# Patient Record
Sex: Female | Born: 1965 | Race: White | Hispanic: No | Smoking: Former smoker
Health system: Southern US, Community
[De-identification: ages and names within clinical notes are randomized; demographics above are authoritative.]

## PROBLEM LIST (undated history)

## (undated) DIAGNOSIS — R000 Tachycardia, unspecified: Secondary | ICD-10-CM

## (undated) DIAGNOSIS — R739 Hyperglycemia, unspecified: Secondary | ICD-10-CM

## (undated) HISTORY — PX: CHOLECYSTECTOMY: SHX55

## (undated) HISTORY — PX: BREAST BIOPSY: SHX20

## (undated) HISTORY — PX: ABDOMINAL HYSTERECTOMY: SHX81

## (undated) HISTORY — PX: APPENDECTOMY: SHX54

---

## 1997-08-14 ENCOUNTER — Ambulatory Visit (HOSPITAL_COMMUNITY): Admission: RE | Admit: 1997-08-14 | Discharge: 1997-08-14 | Payer: Self-pay | Admitting: Internal Medicine

## 1998-04-22 ENCOUNTER — Ambulatory Visit (HOSPITAL_BASED_OUTPATIENT_CLINIC_OR_DEPARTMENT_OTHER): Admission: RE | Admit: 1998-04-22 | Discharge: 1998-04-22 | Payer: Self-pay | Admitting: Orthopedic Surgery

## 2001-04-18 ENCOUNTER — Ambulatory Visit (HOSPITAL_BASED_OUTPATIENT_CLINIC_OR_DEPARTMENT_OTHER): Admission: RE | Admit: 2001-04-18 | Discharge: 2001-04-18 | Payer: Self-pay | Admitting: Orthopedic Surgery

## 2008-10-10 ENCOUNTER — Encounter: Admission: RE | Admit: 2008-10-10 | Discharge: 2008-10-10 | Payer: Self-pay | Admitting: Family Medicine

## 2009-03-05 ENCOUNTER — Encounter: Admission: RE | Admit: 2009-03-05 | Discharge: 2009-03-05 | Payer: Self-pay | Admitting: Obstetrics and Gynecology

## 2009-08-09 ENCOUNTER — Encounter: Admission: RE | Admit: 2009-08-09 | Discharge: 2009-08-09 | Payer: Self-pay | Admitting: Family Medicine

## 2011-10-17 ENCOUNTER — Other Ambulatory Visit: Payer: Self-pay | Admitting: Family Medicine

## 2011-10-17 DIAGNOSIS — Z1231 Encounter for screening mammogram for malignant neoplasm of breast: Secondary | ICD-10-CM

## 2011-11-04 ENCOUNTER — Ambulatory Visit
Admission: RE | Admit: 2011-11-04 | Discharge: 2011-11-04 | Disposition: A | Discharge status: Home / Self Care | Payer: BC Managed Care – PPO | Source: Ambulatory Visit | Attending: Family Medicine | Admitting: Family Medicine

## 2011-11-04 DIAGNOSIS — Z1231 Encounter for screening mammogram for malignant neoplasm of breast: Secondary | ICD-10-CM

## 2011-11-09 ENCOUNTER — Other Ambulatory Visit: Payer: Self-pay | Admitting: Family Medicine

## 2011-11-09 DIAGNOSIS — R928 Other abnormal and inconclusive findings on diagnostic imaging of breast: Secondary | ICD-10-CM

## 2011-11-14 ENCOUNTER — Ambulatory Visit
Admission: RE | Admit: 2011-11-14 | Discharge: 2011-11-14 | Disposition: A | Discharge status: Home / Self Care | Payer: BC Managed Care – PPO | Source: Ambulatory Visit | Attending: Family Medicine | Admitting: Family Medicine

## 2011-11-14 ENCOUNTER — Other Ambulatory Visit: Payer: Self-pay | Admitting: Family Medicine

## 2011-11-14 DIAGNOSIS — R928 Other abnormal and inconclusive findings on diagnostic imaging of breast: Secondary | ICD-10-CM

## 2011-11-14 DIAGNOSIS — N63 Unspecified lump in unspecified breast: Secondary | ICD-10-CM

## 2011-11-25 ENCOUNTER — Ambulatory Visit
Admission: RE | Admit: 2011-11-25 | Discharge: 2011-11-25 | Disposition: A | Discharge status: Home / Self Care | Payer: BC Managed Care – PPO | Source: Ambulatory Visit | Attending: Family Medicine | Admitting: Family Medicine

## 2011-11-25 ENCOUNTER — Other Ambulatory Visit: Payer: Self-pay | Admitting: Family Medicine

## 2011-11-25 DIAGNOSIS — R928 Other abnormal and inconclusive findings on diagnostic imaging of breast: Secondary | ICD-10-CM

## 2011-11-25 DIAGNOSIS — N63 Unspecified lump in unspecified breast: Secondary | ICD-10-CM

## 2013-01-29 ENCOUNTER — Other Ambulatory Visit: Payer: Self-pay

## 2013-01-29 DIAGNOSIS — Z1231 Encounter for screening mammogram for malignant neoplasm of breast: Secondary | ICD-10-CM

## 2013-03-15 ENCOUNTER — Ambulatory Visit
Admission: RE | Admit: 2013-03-15 | Discharge: 2013-03-15 | Disposition: A | Discharge status: Home / Self Care | Payer: BC Managed Care – PPO | Source: Ambulatory Visit

## 2013-03-15 DIAGNOSIS — Z1231 Encounter for screening mammogram for malignant neoplasm of breast: Secondary | ICD-10-CM

## 2014-07-11 ENCOUNTER — Other Ambulatory Visit: Payer: Self-pay

## 2014-07-11 DIAGNOSIS — Z1231 Encounter for screening mammogram for malignant neoplasm of breast: Secondary | ICD-10-CM

## 2014-07-24 ENCOUNTER — Ambulatory Visit
Admission: RE | Admit: 2014-07-24 | Discharge: 2014-07-24 | Disposition: A | Discharge status: Home / Self Care | Payer: BLUE CROSS/BLUE SHIELD | Source: Ambulatory Visit

## 2014-07-24 DIAGNOSIS — Z1231 Encounter for screening mammogram for malignant neoplasm of breast: Secondary | ICD-10-CM

## 2014-12-04 ENCOUNTER — Encounter (HOSPITAL_BASED_OUTPATIENT_CLINIC_OR_DEPARTMENT_OTHER): Payer: Self-pay | Admitting: Emergency Medicine

## 2014-12-04 ENCOUNTER — Emergency Department (HOSPITAL_BASED_OUTPATIENT_CLINIC_OR_DEPARTMENT_OTHER)
Admission: EM | Admit: 2014-12-04 | Discharge: 2014-12-04 | Disposition: A | Discharge status: Home / Self Care | Payer: BLUE CROSS/BLUE SHIELD | Attending: Emergency Medicine | Admitting: Emergency Medicine

## 2014-12-04 ENCOUNTER — Emergency Department (HOSPITAL_BASED_OUTPATIENT_CLINIC_OR_DEPARTMENT_OTHER): Payer: BLUE CROSS/BLUE SHIELD

## 2014-12-04 DIAGNOSIS — N390 Urinary tract infection, site not specified: Secondary | ICD-10-CM

## 2014-12-04 DIAGNOSIS — N200 Calculus of kidney: Secondary | ICD-10-CM | POA: Diagnosis not present

## 2014-12-04 DIAGNOSIS — Z720 Tobacco use: Secondary | ICD-10-CM | POA: Diagnosis not present

## 2014-12-04 DIAGNOSIS — R109 Unspecified abdominal pain: Secondary | ICD-10-CM | POA: Diagnosis present

## 2014-12-04 DIAGNOSIS — Z79899 Other long term (current) drug therapy: Secondary | ICD-10-CM | POA: Diagnosis not present

## 2014-12-04 DIAGNOSIS — R52 Pain, unspecified: Secondary | ICD-10-CM

## 2014-12-04 HISTORY — DX: Tachycardia, unspecified: R00.0

## 2014-12-04 HISTORY — DX: Hyperglycemia, unspecified: R73.9

## 2014-12-04 LAB — URINALYSIS, ROUTINE W REFLEX MICROSCOPIC
Bilirubin Urine: NEGATIVE
Glucose, UA: NEGATIVE mg/dL
Hgb urine dipstick: NEGATIVE
Ketones, ur: NEGATIVE mg/dL
Leukocytes, UA: NEGATIVE
Nitrite: POSITIVE — AB
Protein, ur: NEGATIVE mg/dL
Specific Gravity, Urine: 1.016 (ref 1.005–1.030)
Urobilinogen, UA: 0.2 mg/dL (ref 0.0–1.0)
pH: 5 (ref 5.0–8.0)

## 2014-12-04 LAB — URINE MICROSCOPIC-ADD ON

## 2014-12-04 LAB — CBC WITH DIFFERENTIAL/PLATELET
Basophils Absolute: 0.1 10*3/uL (ref 0.0–0.1)
Basophils Relative: 1 %
Eosinophils Absolute: 0.1 10*3/uL (ref 0.0–0.7)
Eosinophils Relative: 1 %
HCT: 43.6 % (ref 36.0–46.0)
Hemoglobin: 14.8 g/dL (ref 12.0–15.0)
Lymphocytes Relative: 28 %
Lymphs Abs: 4.2 10*3/uL — ABNORMAL HIGH (ref 0.7–4.0)
MCH: 29.5 pg (ref 26.0–34.0)
MCHC: 33.9 g/dL (ref 30.0–36.0)
MCV: 87 fL (ref 78.0–100.0)
Monocytes Absolute: 1.1 10*3/uL — ABNORMAL HIGH (ref 0.1–1.0)
Monocytes Relative: 7 %
Neutro Abs: 9.2 10*3/uL — ABNORMAL HIGH (ref 1.7–7.7)
Neutrophils Relative %: 63 %
Platelets: 283 10*3/uL (ref 150–400)
RBC: 5.01 MIL/uL (ref 3.87–5.11)
RDW: 12.9 % (ref 11.5–15.5)
WBC: 14.6 10*3/uL — ABNORMAL HIGH (ref 4.0–10.5)

## 2014-12-04 LAB — COMPREHENSIVE METABOLIC PANEL
ALK PHOS: 81 U/L (ref 38–126)
ALT: 36 U/L (ref 14–54)
ANION GAP: 11 (ref 5–15)
AST: 30 U/L (ref 15–41)
Albumin: 4.2 g/dL (ref 3.5–5.0)
BILIRUBIN TOTAL: 0.5 mg/dL (ref 0.3–1.2)
BUN: 17 mg/dL (ref 6–20)
CALCIUM: 9.2 mg/dL (ref 8.9–10.3)
CO2: 24 mmol/L (ref 22–32)
CREATININE: 1.09 mg/dL — AB (ref 0.44–1.00)
Chloride: 102 mmol/L (ref 101–111)
GFR calc non Af Amer: 58 mL/min — ABNORMAL LOW (ref >60)
Glucose, Bld: 205 mg/dL — ABNORMAL HIGH (ref 65–99)
Potassium: 3.6 mmol/L (ref 3.5–5.1)
SODIUM: 137 mmol/L (ref 135–145)
TOTAL PROTEIN: 6.9 g/dL (ref 6.5–8.1)

## 2014-12-04 MED ORDER — TAMSULOSIN HCL 0.4 MG PO CAPS
0.4000 mg | ORAL_CAPSULE | Freq: Every day | ORAL | Status: DC
  Administered 2014-12-04: 0.4 mg via ORAL
  Filled 2014-12-04: qty 1

## 2014-12-04 MED ORDER — TAMSULOSIN HCL 0.4 MG PO CAPS: 0.4000 mg | ORAL_CAPSULE | Freq: Every day | ORAL | Status: DC

## 2014-12-04 MED ORDER — SODIUM CHLORIDE 0.9 % IV BOLUS (SEPSIS)
1000.0000 mL | Freq: Once | INTRAVENOUS | Status: AC
  Administered 2014-12-04: 1000 mL via INTRAVENOUS

## 2014-12-04 MED ORDER — TRAMADOL HCL 50 MG PO TABS: 50.0000 mg | ORAL_TABLET | Freq: Four times a day (QID) | ORAL | Status: AC | PRN

## 2014-12-04 MED ORDER — ONDANSETRON HCL 4 MG/2ML IJ SOLN
4.0000 mg | Freq: Once | INTRAMUSCULAR | Status: AC
  Administered 2014-12-04: 4 mg via INTRAVENOUS
  Filled 2014-12-04: qty 2

## 2014-12-04 MED ORDER — IBUPROFEN 800 MG PO TABS: 800.0000 mg | ORAL_TABLET | Freq: Three times a day (TID) | ORAL | Status: DC

## 2014-12-04 MED ORDER — CEFTRIAXONE SODIUM 1 G IJ SOLR
INTRAMUSCULAR | Status: AC
  Filled 2014-12-04: qty 10

## 2014-12-04 MED ORDER — NITROFURANTOIN MONOHYD MACRO 100 MG PO CAPS: 100.0000 mg | ORAL_CAPSULE | Freq: Two times a day (BID) | ORAL | Status: DC

## 2014-12-04 MED ORDER — ONDANSETRON 8 MG PO TBDP: ORAL_TABLET | ORAL | Status: DC

## 2014-12-04 MED ORDER — OXYCODONE-ACETAMINOPHEN 5-325 MG PO TABS
2.0000 | ORAL_TABLET | Freq: Once | ORAL | Status: AC
  Administered 2014-12-04: 2 via ORAL
  Filled 2014-12-04: qty 2

## 2014-12-04 MED ORDER — CEFTRIAXONE SODIUM 1 G IJ SOLR
1.0000 g | Freq: Once | INTRAMUSCULAR | Status: AC
  Administered 2014-12-04: 1 g via INTRAVENOUS

## 2014-12-04 MED ORDER — KETOROLAC TROMETHAMINE 30 MG/ML IJ SOLN
30.0000 mg | Freq: Once | INTRAMUSCULAR | Status: AC
  Administered 2014-12-04: 30 mg via INTRAVENOUS
  Filled 2014-12-04: qty 1

## 2014-12-04 NOTE — ED Notes (Signed)
Patient transported to CT 

## 2014-12-04 NOTE — ED Provider Notes (Signed)
CSN: [id number]     Arrival date & time 12/04/14  0129 History   First MD Initiated Contact with Patient 12/04/14 0158     Chief Complaint  Patient presents with  . Abdominal Pain     (Consider location/radiation/quality/duration/timing/severity/associated sxs/prior Treatment) Patient is a 49 y.o. female presenting with flank pain.  Flank Pain This is a new problem. The current episode started 12 to 24 hours ago. The problem occurs constantly. The problem has not changed since onset.Pertinent negatives include no chest pain, no headaches and no shortness of breath. Associated symptoms comments: Groin pain. Nothing aggravates the symptoms. Nothing relieves the symptoms. She has tried nothing for the symptoms. The treatment provided no relief.  Started with several bouts of diarrhea then flank pain now vomiting and groin pain.    Past Medical History  Diagnosis Date  . Hyperglycemia   . Tachycardia    Past Surgical History  Procedure Laterality Date  . Abdominal hysterectomy    . Appendectomy    . Cholecystectomy     History reviewed. No pertinent family history. Social History  Substance Use Topics  . Smoking status: Current Every Day Smoker -- 0.50 packs/day  . Smokeless tobacco: None  . Alcohol Use: No   OB History    No data available     Review of Systems  Respiratory: Negative for shortness of breath.   Cardiovascular: Negative for chest pain.  Gastrointestinal: Negative for nausea, vomiting and diarrhea.  Genitourinary: Positive for flank pain. Negative for dysuria and hematuria.  Neurological: Negative for headaches.  All other systems reviewed and are negative.     Allergies  Morphine and related  Home Medications   Prior to Admission medications   Medication Sig Start Date End Date Taking? Authorizing Saren Corkern  metFORMIN (GLUCOPHAGE) 1000 MG tablet Take 1,000 mg by mouth at bedtime.   Yes Historical Yamila Cragin, MD  nebivolol (BYSTOLIC) 5 MG tablet Take 5  mg by mouth daily.   Yes Historical Jayan Raymundo, MD   BP 180/87 mmHg  Pulse 96  Temp(Src) 98.6 F (37 C) (Oral)  Resp 22  Ht  (1.702 m)  Wt 247 lb (112.038 kg)  BMI 38.68 kg/m2  SpO2 98%  LMP  Physical Exam  Constitutional: She is oriented to person, place, and time. She appears well-developed and well-nourished.  HENT:  Head: Normocephalic and atraumatic.  Mouth/Throat: Oropharynx is clear and moist.  Eyes: Conjunctivae are normal. Pupils are equal, round, and reactive to light.  Neck: Normal range of motion. Neck supple.  Cardiovascular: Normal rate, regular rhythm and intact distal pulses.   Pulmonary/Chest: Effort normal and breath sounds normal. No respiratory distress. She has no wheezes. She has no rales.  Abdominal: Soft. Bowel sounds are normal. There is no tenderness. There is no rebound and no guarding.  Musculoskeletal: Normal range of motion.  Neurological: She is alert and oriented to person, place, and time.  Skin: Skin is warm and dry. She is not diaphoretic.  Psychiatric: She has a normal mood and affect.    ED Course  Procedures (including critical care time) Labs Review Labs Reviewed  CBC WITH DIFFERENTIAL/PLATELET - Abnormal; Notable for the following:    WBC 14.6 (*)    Neutro Abs 9.2 (*)    Lymphs Abs 4.2 (*)    Monocytes Absolute 1.1 (*)    All other components within normal limits  COMPREHENSIVE METABOLIC PANEL - Abnormal; Notable for the following:    Glucose, Bld 205 (*)  Creatinine, Ser 1.09 (*)    GFR calc non Af Amer 58 (*)    All other components within normal limits  URINALYSIS, ROUTINE W REFLEX MICROSCOPIC (NOT AT [facility]) - Abnormal; Notable for the following:    Color, Urine ORANGE (*)    Nitrite POSITIVE (*)    All other components within normal limits  URINE MICROSCOPIC-ADD ON - Abnormal; Notable for the following:    Squamous Epithelial / LPF FEW (*)    Bacteria, UA MANY (*)    All other components within normal limits     Imaging Review Ct Renal Stone Study  12/04/2014   CLINICAL DATA:  Left flank and left lower quadrant pain for 10 hr.  EXAM: CT ABDOMEN AND PELVIS WITHOUT CONTRAST  TECHNIQUE: Multidetector CT imaging of the abdomen and pelvis was performed following the standard protocol without IV contrast.  COMPARISON:  None.  FINDINGS: There is a 4 mm nodule in the right middle lobe. 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  There is a 2 mm obstructing stone in the distal left ureter just proximal to the ureterovesicular junction with moderate proximal hydroureteronephrosis and perinephric stranding. No additional nonobstructing stones in either kidney. Urinary bladder is near completely decompressed.  The liver is prominent in size with likely steatosis. No evidence of focal lesion. Postcholecystectomy with clips in the gallbladder fossa. No biliary dilatation. The unenhanced spleen, pancreas, and adrenal glands are normal.  The stomach is distended with ingested contents. There are no dilated or thickened bowel loops. There is moderate diverticulosis of the distal colon without diverticulitis. Small volume of colonic stool. The appendix is surgically absent per report.  No retroperitoneal adenopathy. Abdominal aorta is normal in caliber. Mild atherosclerosis of the abdominal aorta without aneurysm.  Within the pelvis the uterus is surgically absent. There is a 4.1 cm cyst in the deep pelvis that may reflect peritoneal inclusion cyst. No surrounding inflammation. Neither ovary is identified.  There are no acute or suspicious osseous abnormalities. Incidental note of 4 non-rib-bearing lumbar vertebra.  IMPRESSION: 1. Obstructing 2 mm stone in the distal left ureter just above the ureterovesicular junction with moderate hydroureteronephrosis and perinephric stranding. No additional nonobstructing stones in either kidney. 2. Diverticulosis without diverticulitis. 3. Right middle lobe pulmonary nodule  measures 4 mm. If the patient is at high risk for bronchogenic carcinoma, follow-up chest CT at 1 year is recommended. If the patient is at low risk, no follow-up is needed. This recommendation follows the consensus statement: Guidelines for Management of Small Pulmonary Nodules Detected on CT Scans: A Statement from the Fleischner Society as published in Radiology 2005; 237:395-400.   Electronically Signed   By: Rubye Oaks M.D.   On: 12/04/2014 02:34   I have personally reviewed and evaluated these images and lab results as part of my medical decision-making.   EKG Interpretation None      MDM   Final diagnoses:  Pain    Results for orders placed or performed during the hospital encounter of 12/04/14  CBC with Differential/Platelet  Result Value Ref Range   WBC 14.6 (H) 4.0 - 10.5 K/uL   RBC 5.01 3.87 - 5.11 MIL/uL   Hemoglobin 14.8 12.0 - 15.0 g/dL   HCT 16.1 09.6 - 04.5 %   MCV 87.0 78.0 - 100.0 fL   MCH 29.5 26.0 - 34.0 pg   MCHC 33.9 30.0 - 36.0 g/dL   RDW 40.9 81.1 - 91.4 %   Platelets 283 150 - 400 K/uL  Neutrophils Relative % 63 %   Neutro Abs 9.2 (H) 1.7 - 7.7 K/uL   Lymphocytes Relative 28 %   Lymphs Abs 4.2 (H) 0.7 - 4.0 K/uL   Monocytes Relative 7 %   Monocytes Absolute 1.1 (H) 0.1 - 1.0 K/uL   Eosinophils Relative 1 %   Eosinophils Absolute 0.1 0.0 - 0.7 K/uL   Basophils Relative 1 %   Basophils Absolute 0.1 0.0 - 0.1 K/uL  Comprehensive metabolic panel  Result Value Ref Range   Sodium 137 135 - 145 mmol/L   Potassium 3.6 3.5 - 5.1 mmol/L   Chloride 102 101 - 111 mmol/L   CO2 24 22 - 32 mmol/L   Glucose, Bld 205 (H) 65 - 99 mg/dL   BUN 17 6 - 20 mg/dL   Creatinine, Ser 1.61 (H) 0.44 - 1.00 mg/dL   Calcium 9.2 8.9 - 09.6 mg/dL   Total Protein 6.9 6.5 - 8.1 g/dL   Albumin 4.2 3.5 - 5.0 g/dL   AST 30 15 - 41 U/L   ALT 36 14 - 54 U/L   Alkaline Phosphatase 81 38 - 126 U/L   Total Bilirubin 0.5 0.3 - 1.2 mg/dL   GFR calc non Af Amer 58 (L) >60  mL/min   GFR calc Af Amer >60 >60 mL/min   Anion gap 11 5 - 15  Urinalysis, Routine w reflex microscopic (not at [facility])  Result Value Ref Range   Color, Urine ORANGE (A) YELLOW   APPearance CLEAR CLEAR   Specific Gravity, Urine 1.016 1.005 - 1.030   pH 5.0 5.0 - 8.0   Glucose, UA NEGATIVE NEGATIVE mg/dL   Hgb urine dipstick NEGATIVE NEGATIVE   Bilirubin Urine NEGATIVE NEGATIVE   Ketones, ur NEGATIVE NEGATIVE mg/dL   Protein, ur NEGATIVE NEGATIVE mg/dL   Urobilinogen, UA 0.2 0.0 - 1.0 mg/dL   Nitrite POSITIVE (A) NEGATIVE   Leukocytes, UA NEGATIVE NEGATIVE  Urine microscopic-add on  Result Value Ref Range   Squamous Epithelial / LPF FEW (A) RARE   WBC, UA 0-2 <3 WBC/hpf   RBC / HPF 0-2 <3 RBC/hpf   Bacteria, UA MANY (A) RARE   Ct Renal Stone Study  12/04/2014   CLINICAL DATA:  Left flank and left lower quadrant pain for 10 hr.  EXAM: CT ABDOMEN AND PELVIS WITHOUT CONTRAST  TECHNIQUE: Multidetector CT imaging of the abdomen and pelvis was performed following the standard protocol without IV contrast.  COMPARISON:  None.  FINDINGS: There is a 4 mm nodule in the right middle lobe. 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  There is a 2 mm obstructing stone in the distal left ureter just proximal to the ureterovesicular junction with moderate proximal hydroureteronephrosis and perinephric stranding. No additional nonobstructing stones in either kidney. Urinary bladder is near completely decompressed.  The liver is prominent in size with likely steatosis. No evidence of focal lesion. Postcholecystectomy with clips in the gallbladder fossa. No biliary dilatation. The unenhanced spleen, pancreas, and adrenal glands are normal.  The stomach is distended with ingested contents. There are no dilated or thickened bowel loops. There is moderate diverticulosis of the distal colon without diverticulitis. Small volume of colonic stool. The appendix is surgically absent per report.  No  retroperitoneal adenopathy. Abdominal aorta is normal in caliber. Mild atherosclerosis of the abdominal aorta without aneurysm.  Within the pelvis the uterus is surgically absent. There is a 4.1 cm cyst in the deep pelvis that may reflect peritoneal inclusion cyst. No  surrounding inflammation. Neither ovary is identified.  There are no acute or suspicious osseous abnormalities. Incidental note of 4 non-rib-bearing lumbar vertebra.  IMPRESSION: 1. Obstructing 2 mm stone in the distal left ureter just above the ureterovesicular junction with moderate hydroureteronephrosis and perinephric stranding. No additional nonobstructing stones in either kidney. 2. Diverticulosis without diverticulitis. 3. Right middle lobe pulmonary nodule measures 4 mm. If the patient is at high risk for bronchogenic carcinoma, follow-up chest CT at 1 year is recommended. If the patient is at low risk, no follow-up is needed. This recommendation follows the consensus statement: Guidelines for Management of Small Pulmonary Nodules Detected on CT Scans: A Statement from the Fleischner Society as published in Radiology 2005; 237:395-400.   Electronically Signed   By: Rubye Oaks M.D.   On: 12/04/2014 02:34    Medications  tamsulosin (FLOMAX) capsule 0.4 mg (0.4 mg Oral Given 12/04/14 0250)  ketorolac (TORADOL) 30 MG/ML injection 30 mg (30 mg Intravenous Given 12/04/14 0201)  sodium chloride 0.9 % bolus 1,000 mL (1,000 mLs Intravenous New Bag/Given 12/04/14 0155)  ondansetron (ZOFRAN) injection 4 mg (4 mg Intravenous Given 12/04/14 0201)  cefTRIAXone (ROCEPHIN) 1 g in dextrose 5 % 50 mL IVPB (1 g Intravenous New Bag/Given 12/04/14 0246)  oxyCODONE-acetaminophen (PERCOCET/ROXICET) 5-325 MG per tablet 2 tablet (2 tablets Oral Given 12/04/14 0250)  cefTRIAXone (ROCEPHIN) 1 G injection (  Duplicate 12/04/14 0257)    Will treat for infected kidney stone with antibiotics and pain medication and flomax and zofran.  Strain all urine and follow  up with urology in 7 days  April Palumbo, MD 12/04/14 [phone]

## 2014-12-04 NOTE — ED Notes (Signed)
MD at bedside discussing test results and plan of care. 

## 2014-12-04 NOTE — ED Notes (Signed)
MD at bedside. 

## 2014-12-04 NOTE — ED Notes (Signed)
LLQ pain started 4pm 9/28.  Left flank pain since 10pm.  Diarrhea for 4 hours yesterday morning. Vomiting started an hour ago.

## 2016-01-06 ENCOUNTER — Other Ambulatory Visit: Payer: Self-pay | Admitting: Family Medicine

## 2016-01-06 DIAGNOSIS — Z1231 Encounter for screening mammogram for malignant neoplasm of breast: Secondary | ICD-10-CM

## 2016-01-27 ENCOUNTER — Ambulatory Visit
Admission: RE | Admit: 2016-01-27 | Discharge: 2016-01-27 | Disposition: A | Discharge status: Home / Self Care | Payer: PRIVATE HEALTH INSURANCE | Source: Ambulatory Visit | Attending: Family Medicine | Admitting: Family Medicine

## 2016-01-27 DIAGNOSIS — Z1231 Encounter for screening mammogram for malignant neoplasm of breast: Secondary | ICD-10-CM

## 2017-02-15 IMAGING — CT CT RENAL STONE PROTOCOL
2 of 4 series · 16 of 46 positions shown, 18 images · non-contrast
Comparison: None.

CLINICAL DATA: Left flank and left lower quadrant pain for 10 hr.

EXAM:
CT ABDOMEN AND PELVIS WITHOUT CONTRAST
TECHNIQUE: Multidetector CT imaging of the abdomen and pelvis was performed
following the standard protocol without IV contrast.

[Series 2: renal stone > 200 lbs 5.0 b31f · axial · 0.95mm/px · z∈[-509,-14]mm · 13 of 109 slices shown, 15 images]
[im 5/109  soft-tissue]
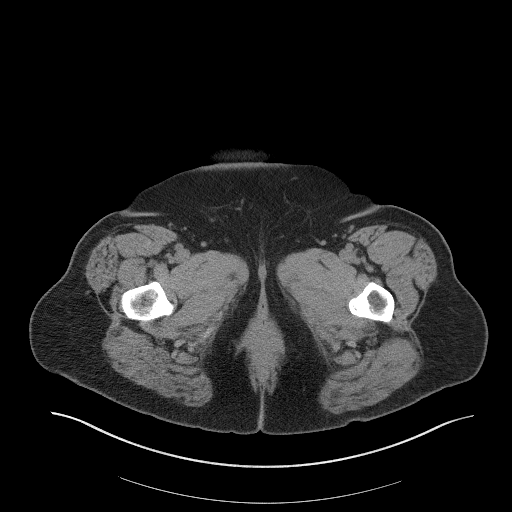
[im 5/109  bone]
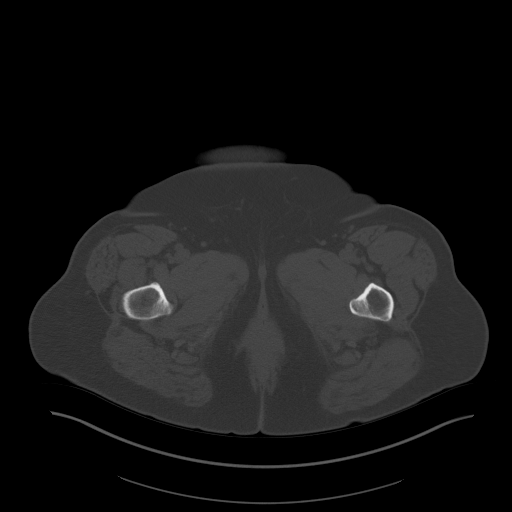
[im 15/109  soft-tissue]
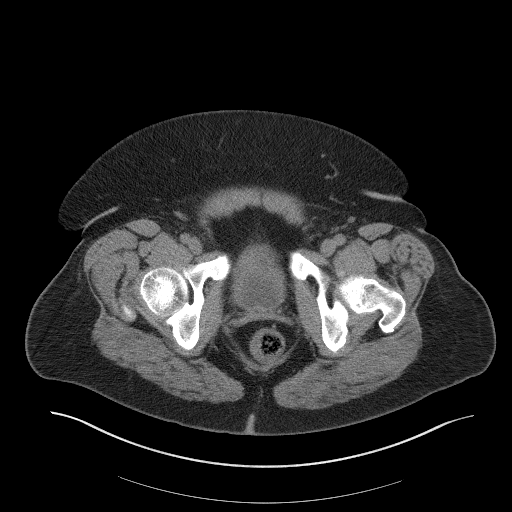
[im 24/109  soft-tissue]
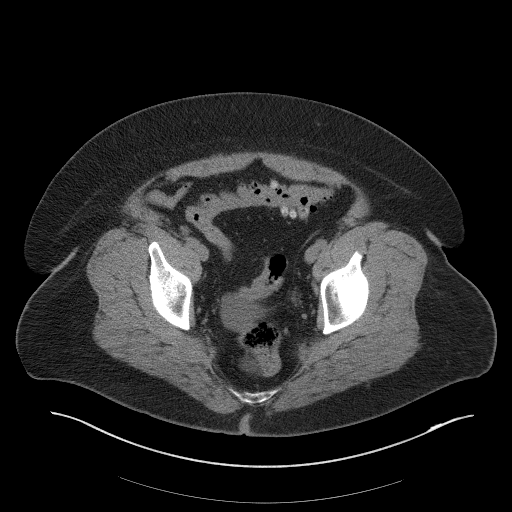
[im 29/109  soft-tissue]
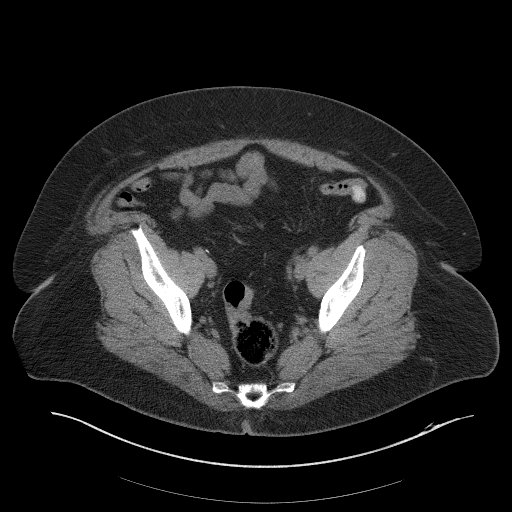
[im 38/109  soft-tissue]
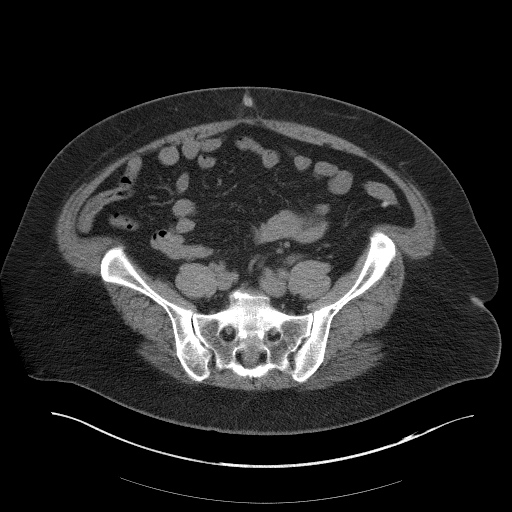
[im 47/109  soft-tissue]
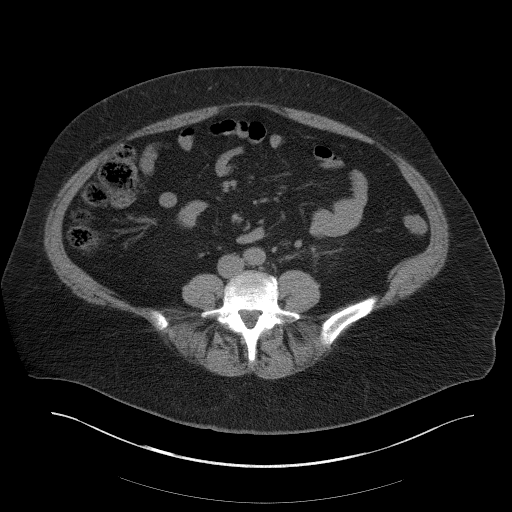
[im 57/109  soft-tissue]
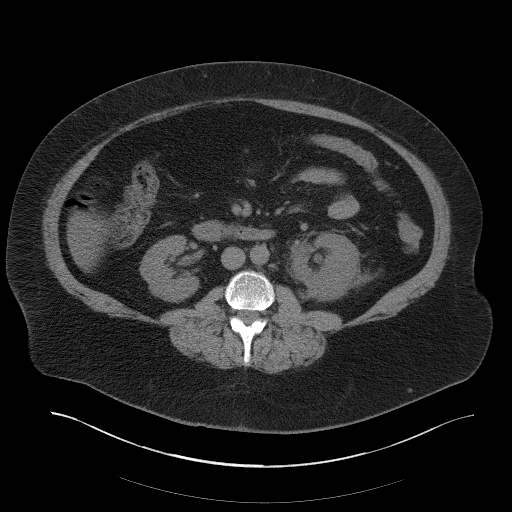
[im 62/109  soft-tissue]
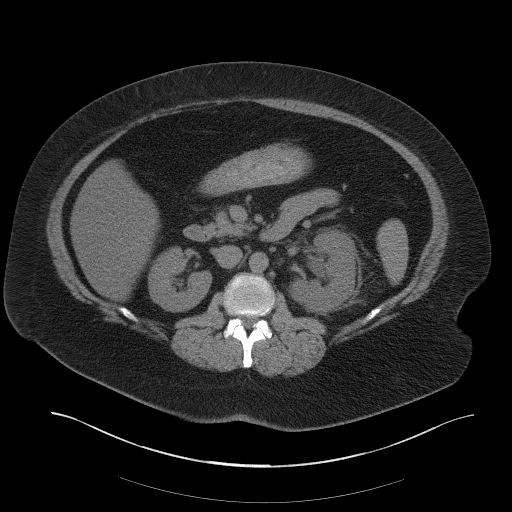
[im 71/109  soft-tissue]
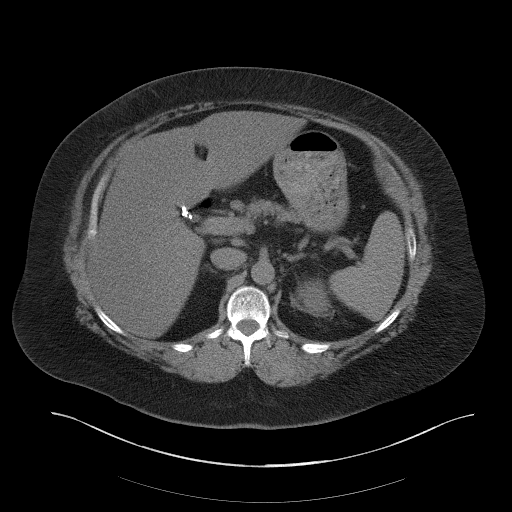
[im 71/109  bone]
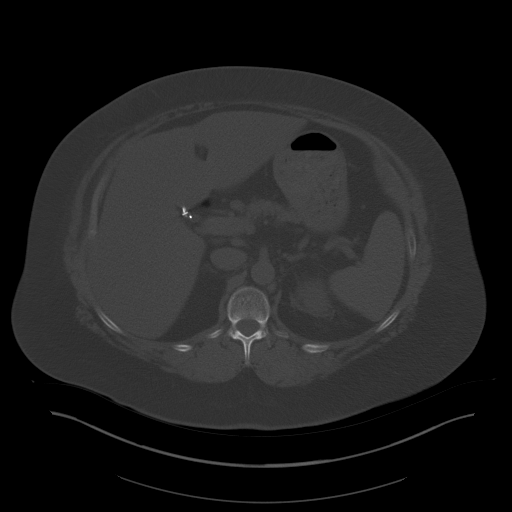
[im 80/109  soft-tissue]
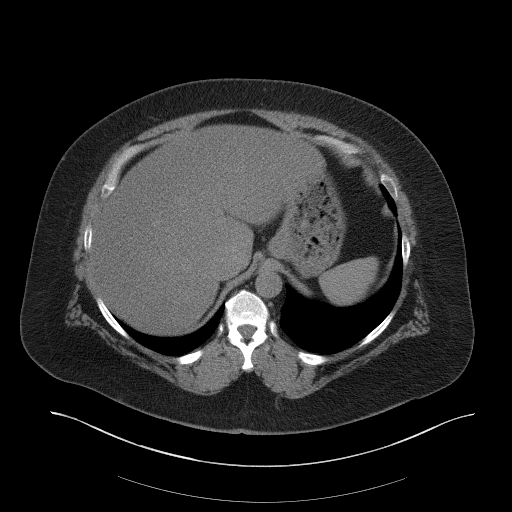
[im 85/109  soft-tissue]
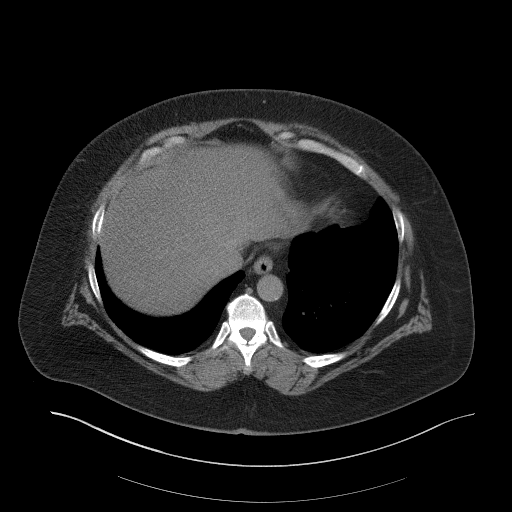
[im 94/109  soft-tissue]
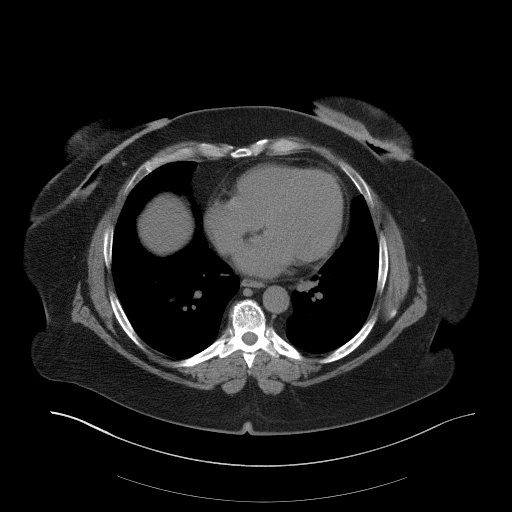
[im 104/109  soft-tissue]
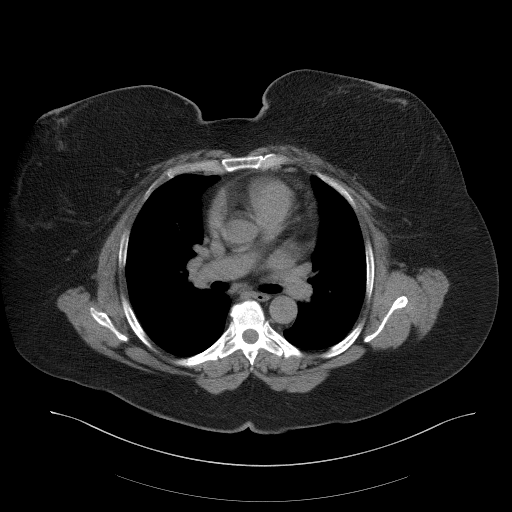

[Series 5: renal stone 3.0 coronal · coronal · 0.95mm/px · 3 of 89 slices shown]
[im 30/89  soft-tissue]
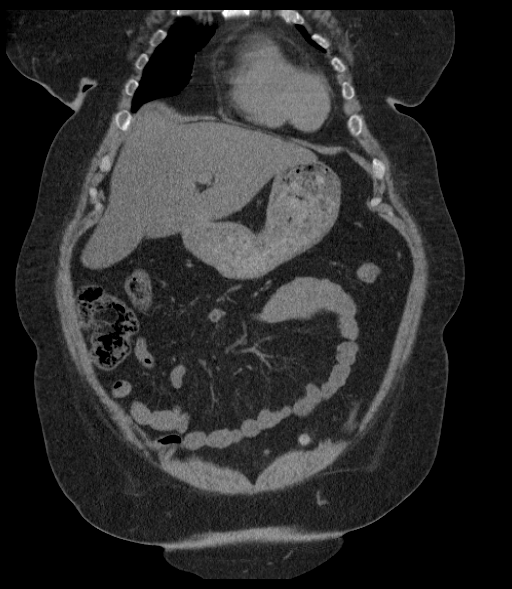
[im 40/89  soft-tissue]
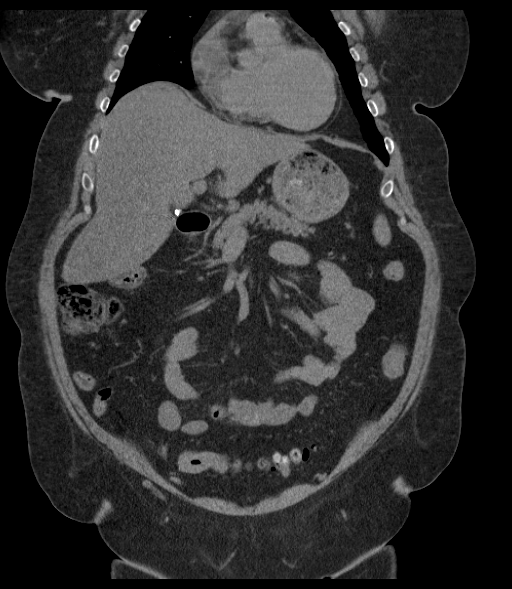
[im 49/89  soft-tissue]
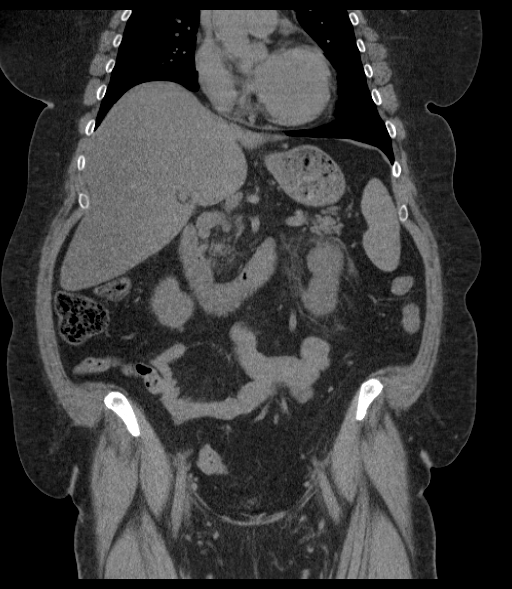

[16 of 46 positions shown; findings below may reference images not displayed]

FINDINGS: There is a 4 mm nodule in the right middle lobe. Borderline
cardiomegaly, there is mediastinal lipomatosis.

There is a 2 mm obstructing stone in the distal left ureter just
proximal to the ureterovesicular junction with moderate proximal
hydroureteronephrosis and perinephric stranding. No additional
nonobstructing stones in either kidney. Urinary bladder is near
completely decompressed.

The liver is prominent in size with likely steatosis. No evidence of
focal lesion. Postcholecystectomy with clips in the gallbladder
fossa. No biliary dilatation. The unenhanced spleen, pancreas, and
adrenal glands are normal.

The stomach is distended with ingested contents. There are no
dilated or thickened bowel loops. There is moderate diverticulosis
of the distal colon without diverticulitis. Small volume of colonic
stool. The appendix is surgically absent per report.

No retroperitoneal adenopathy. Abdominal aorta is normal in caliber.
Mild atherosclerosis of the abdominal aorta without aneurysm.

Within the pelvis the uterus is surgically absent. There is a 4.1 cm
cyst in the deep pelvis that may reflect peritoneal inclusion cyst.
No surrounding inflammation. Neither ovary is identified.

There are no acute or suspicious osseous abnormalities. Incidental
note of 4 non-rib-bearing lumbar vertebra.
IMPRESSION: 1. Obstructing 2 mm stone in the distal left ureter just above the
ureterovesicular junction with moderate hydroureteronephrosis and
perinephric stranding. No additional nonobstructing stones in either
kidney.
2. Diverticulosis without diverticulitis.
3. Right middle lobe pulmonary nodule measures 4 mm. If the patient
is at high risk for bronchogenic carcinoma, follow-up chest CT at 1
year is recommended. If the patient is at low risk, no follow-up is
needed. This recommendation follows the consensus statement:
Guidelines for Management of Small Pulmonary Nodules Detected on CT
Scans: A Statement from the [HOSPITAL] as published in

## 2017-07-19 ENCOUNTER — Other Ambulatory Visit: Payer: Self-pay | Admitting: Family Medicine

## 2017-07-19 DIAGNOSIS — Z1231 Encounter for screening mammogram for malignant neoplasm of breast: Secondary | ICD-10-CM

## 2017-09-22 ENCOUNTER — Ambulatory Visit
Admission: RE | Admit: 2017-09-22 | Discharge: 2017-09-22 | Disposition: A | Discharge status: Home / Self Care | Payer: PRIVATE HEALTH INSURANCE | Source: Ambulatory Visit | Attending: Family Medicine | Admitting: Family Medicine

## 2017-09-22 DIAGNOSIS — Z1231 Encounter for screening mammogram for malignant neoplasm of breast: Secondary | ICD-10-CM

## 2020-12-08 ENCOUNTER — Other Ambulatory Visit: Payer: Self-pay

## 2020-12-08 ENCOUNTER — Ambulatory Visit (INDEPENDENT_AMBULATORY_CARE_PROVIDER_SITE_OTHER): Payer: No Typology Code available for payment source | Admitting: Cardiology

## 2020-12-08 ENCOUNTER — Other Ambulatory Visit (HOSPITAL_COMMUNITY): Payer: Self-pay

## 2020-12-08 ENCOUNTER — Encounter (HOSPITAL_BASED_OUTPATIENT_CLINIC_OR_DEPARTMENT_OTHER): Payer: Self-pay | Admitting: Cardiology

## 2020-12-08 VITALS — BP 128/88 | HR 95 | Ht 67.0 in | Wt 207.1 lb

## 2020-12-08 DIAGNOSIS — I209 Angina pectoris, unspecified: Secondary | ICD-10-CM

## 2020-12-08 DIAGNOSIS — R000 Tachycardia, unspecified: Secondary | ICD-10-CM

## 2020-12-08 DIAGNOSIS — I208 Other forms of angina pectoris: Secondary | ICD-10-CM

## 2020-12-08 DIAGNOSIS — E7800 Pure hypercholesterolemia, unspecified: Secondary | ICD-10-CM | POA: Diagnosis not present

## 2020-12-08 DIAGNOSIS — R072 Precordial pain: Secondary | ICD-10-CM | POA: Diagnosis not present

## 2020-12-08 DIAGNOSIS — Z720 Tobacco use: Secondary | ICD-10-CM

## 2020-12-08 DIAGNOSIS — Z01812 Encounter for preprocedural laboratory examination: Secondary | ICD-10-CM

## 2020-12-08 DIAGNOSIS — E119 Type 2 diabetes mellitus without complications: Secondary | ICD-10-CM

## 2020-12-08 DIAGNOSIS — I10 Essential (primary) hypertension: Secondary | ICD-10-CM

## 2020-12-08 DIAGNOSIS — E785 Hyperlipidemia, unspecified: Secondary | ICD-10-CM | POA: Insufficient documentation

## 2020-12-08 MED ORDER — IVABRADINE HCL 5 MG PO TABS
5.0000 mg | ORAL_TABLET | ORAL | 0 refills | Status: DC
  Filled 2020-12-08: qty 3, 3d supply, fill #0

## 2020-12-08 NOTE — Assessment & Plan Note (Signed)
Given her jaw pain/neck pain which could be a possible anginal equivalent, we will go ahead and proceed with coronary CT scan with possible FFR analysis for further coronary evaluation.  She is a smoker with hypertension, tachycardia, hyperlipidemia, maternal family history of coronary artery disease.  If CT scan is unremarkable, certainly her jaw pain could be more musculoskeletal in etiology.

## 2020-12-08 NOTE — Assessment & Plan Note (Signed)
She has had tachycardia previously.  Treated with nadolol currently.

## 2020-12-08 NOTE — Assessment & Plan Note (Signed)
Continue to encourage cessation.  She is now smoking 5 cigarettes a day.

## 2020-12-08 NOTE — Assessment & Plan Note (Signed)
Currently on Zetia, Lopid for medical management.  LDL 44 triglycerides have been as high as 1072.  Would consider Vascepa if it is covered.

## 2020-12-08 NOTE — Patient Instructions (Signed)
Medication Instructions:  The current medical regimen is effective;  continue present plan and medications.  *If you need a refill on your cardiac medications before your next appointment, please call your pharmacy*  Lab Work: None needed today. If you have labs (blood work) drawn today and your tests are completely normal, you will receive your results only by: MyChart Message (if you have MyChart) OR A paper copy in the mail If you have any lab test that is abnormal or we need to change your treatment, we will call you to review the results.   Testing/Procedures:   Your cardiac CT will be scheduled at:   [facility] [address] [place], [place] [address] [phone]  Please arrive at the [facility] main entrance (entrance A) of [facility] 30 minutes prior to test start time. Proceed to the [facility] Radiology Department (first floor) to check-in and test prep.  Please follow these instructions carefully (unless otherwise directed):  On the Night Before the Test: Be sure to Drink plenty of water. Do not consume any caffeinated/decaffeinated beverages or chocolate 12 hours prior to your test. Do not take any antihistamines 12 hours prior to your test.  On the Day of the Test: Drink plenty of water until 1 hour prior to the test. Do not eat any food 4 hours prior to the test. You may take your regular medications prior to the test.  Take metoprolol (Lopressor) two hours prior to test. HOLD Furosemide/Hydrochlorothiazide morning of the test. FEMALES- please wear underwire-free bra if available, avoid dresses & tight clothing      After the Test: Drink plenty of water. After receiving IV contrast, you may experience a mild flushed feeling. This is normal. On occasion, you may experience a mild rash up to 24 hours after the test. This is not dangerous. If this occurs, you can take Benadryl 25 mg and increase your fluid intake. If you experience  trouble breathing, this can be serious. If it is severe call 911 IMMEDIATELY. If it is mild, please call our office. If you take any of these medications: Glipizide/Metformin, Avandament, Glucavance, please do not take 48 hours after completing test unless otherwise instructed.  Please allow 2-4 weeks for scheduling of routine cardiac CTs. Some insurance companies require a pre-authorization which may delay scheduling of this test.   For non-scheduling related questions, please contact the cardiac imaging nurse navigator should you have any questions/concerns: Rockwell [place], Cardiac Imaging Nurse Navigator Larey Brick, Cardiac Imaging Nurse Navigator [facility] Direct Office Dial: [phone]   For scheduling needs, including cancellations and rescheduling, please call [place], [phone].  Follow-Up: At [facility], you and your health needs are our priority.  As part of our continuing mission to provide you with exceptional heart care, we have created designated Provider Care Teams.  These Care Teams include your primary Cardiologist (physician) and Advanced Practice Providers (APPs -  Physician Assistants and Nurse Practitioners) who all work together to provide you with the care you need, when you need it.  We recommend signing up for the patient portal called "MyChart".  Sign up information is provided on this After Visit Summary.  MyChart is used to connect with patients for Virtual Visits (Telemedicine).  Patients are able to view lab/test results, encounter notes, upcoming appointments, etc.  Non-urgent messages can be sent to your provider as well.   To learn more about what you can do with MyChart, go to [email or web address].  Your next appointment:   Follow up will be based on the results of the abovet esting.  Thank you for choosing [facility]!!

## 2020-12-08 NOTE — Progress Notes (Signed)
Cardiology Office Note:    Date:  12/08/2020   ID:  Alexis Moore, DOB Jul 21, 1965, MRN [id number]  PCP:  Joycelyn Rua, MD   [facility] Providers Cardiologist:  None     Referring MD: Joycelyn Rua, MD   History of Present Illness:    Alexis Moore is a 55 y.o. female here for the evaluation of atypical angina, and neck pain at the request of Dr. Joycelyn Rua.  Today: She is accompanied by her husband.  She had 3 episodes of sudden onset jaw pain that radiates down her neck, but no further. She describes this as an intense, sharp pain, almost tingly. Additionally, during the episodes she feels like she needs to take a deep breath but is unable to. Her first 2 episodes occurred in June, and lasted only a few minutes at home. The third episode was in July while she was working. The pain was more intense, but seemed to dissipate quickly. It then suddenly returned but even more severely, and was associated with blurry vision. After 5 more minutes this episode spontaneously resolved. Since then, she has had no recurrent episodes.  Prior to her episodes of jaw and neck pain, she was exercising regularly by going to the gym 3 days a week. This included lifting weights and walking on a treadmill for 30-50 minutes.  Currently she is smoking 5 cigarettes a day, and continues to work on quitting.  Heart disease on both sides of family. Biological father died of heart attack. Maternal grandfather and uncles had heart attacks. Mother has tachycardia.  She denies any palpitations, or chest pain. No lightheadedness, headaches, syncope, orthopnea, or PND. Also has no lower extremity edema or exertional symptoms.  Past Medical History:  Diagnosis Date   Hyperglycemia    Tachycardia     Past Surgical History:  Procedure Laterality Date   ABDOMINAL HYSTERECTOMY     APPENDECTOMY     CHOLECYSTECTOMY      Current Medications: Current Meds  Medication Sig   diazepam (VALIUM) 5 MG  tablet Take 5 mg by mouth every 4 (four) hours as needed for anxiety.   ezetimibe (ZETIA) 10 MG tablet Take 10 mg by mouth daily.   gemfibrozil (LOPID) 600 MG tablet Take 600 mg by mouth 2 (two) times daily.   glipiZIDE (GLUCOTROL) 10 MG tablet Take 10 mg by mouth 2 (two) times daily.   HYDROcodone-acetaminophen (NORCO/VICODIN) 5-325 MG tablet Take by mouth as needed.   ibuprofen (ADVIL,MOTRIN) 800 MG tablet Take 1 tablet (800 mg total) by mouth 3 (three) times daily.   ivabradine (CORLANOR) 5 MG TABS tablet Take 1 tablet (5 mg total) by mouth as directed. Take 3 tablets 2 hours before your CT scan   metFORMIN (GLUCOPHAGE-XR) 500 MG 24 hr tablet Take 1,000 mg by mouth 2 (two) times daily.   nadolol (CORGARD) 20 MG tablet Take 10 mg by mouth in the morning and at bedtime.   nitroGLYCERIN (NITROSTAT) 0.4 MG SL tablet Place under the tongue as needed.   ondansetron (ZOFRAN ODT) 8 MG disintegrating tablet 8mg  ODT q8 hours prn nausea   tamsulosin (FLOMAX) 0.4 MG CAPS capsule Take 1 capsule (0.4 mg total) by mouth daily.   traMADol (ULTRAM) 50 MG tablet Take 1 tablet (50 mg total) by mouth every 6 (six) hours as needed.   TRULICITY 0.75 MG/0.5ML SOPN Inject into the skin once a week.     Allergies:   Morphine and related   Social History  Socioeconomic History   Marital status: Single    Spouse name: Not on file   Number of children: Not on file   Years of education: Not on file   Highest education level: Not on file  Occupational History   Not on file  Tobacco Use   Smoking status: Every Day    Packs/day: 0.50    Types: Cigarettes   Smokeless tobacco: Never  Substance and Sexual Activity   Alcohol use: No   Drug use: No   Sexual activity: Yes    Birth control/protection: Surgical  Other Topics Concern   Not on file  Social History Narrative   Not on file   Social Determinants of Health   Financial Resource Strain: Not on file  Food Insecurity: Not on file  Transportation  Needs: Not on file  Physical Activity: Not on file  Stress: Not on file  Social Connections: Not on file     Family History: The patient's family history is negative for Breast cancer.  ROS:   Please see the history of present illness.    (+) Jaw pain radiating down neck (+) Shortness of breath (+) Blurry vision All other systems reviewed and are negative.  EKGs/Labs/Other Studies Reviewed:    The following studies were reviewed today: No prior cardiovascular studies available.  EKG:  EKG is personally reviewed and interpreted. 12/08/2020: Sinus rhythm. Rate 95 bpm.  Recent Labs: No results found for requested labs within last 8760 hours.   Recent Lipid Panel No results found for: CHOL, TRIG, HDL, CHOLHDL, VLDL, LDLCALC, LDLDIRECT   Risk Assessment/Calculations:          Physical Exam:    VS:  BP 128/88   Pulse 95   Ht 5\' 7"  (1.702 m)   Wt 207 lb 1.6 oz (93.9 kg)   BMI 32.44 kg/m     Wt Readings from Last 3 Encounters:  12/08/20 207 lb 1.6 oz (93.9 kg)  12/04/14 247 lb (112 kg)     GEN: Well nourished, well developed in no acute distress HEENT: Normal NECK: No JVD; No carotid bruits LYMPHATICS: No lymphadenopathy CARDIAC: RRR, no murmurs, rubs, gallops RESPIRATORY:  Clear to auscultation without rales, wheezing or rhonchi  ABDOMEN: Soft, non-tender, non-distended MUSCULOSKELETAL:  No edema; No deformity  SKIN: Warm and dry NEUROLOGIC:  Alert and oriented x 3 PSYCHIATRIC:  Normal affect   ASSESSMENT:    1. Precordial pain   2. Angina pectoris (HCC)   3. Pre-procedure lab exam   4. Other forms of angina pectoris (HCC)   5. Pure hypercholesterolemia   6. Tobacco use   7. Diabetes mellitus with coincident hypertension (HCC)   8. Tachycardia    PLAN:    In order of problems listed above: Other forms of angina pectoris (HCC) Given her jaw pain/neck pain which could be a possible anginal equivalent, we will go ahead and proceed with coronary CT  scan with possible FFR analysis for further coronary evaluation.  She is a smoker with hypertension, tachycardia, hyperlipidemia, maternal family history of coronary artery disease.  If CT scan is unremarkable, certainly her jaw pain could be more musculoskeletal in etiology.  Hyperlipidemia Currently on Zetia, Lopid for medical management.  LDL 44 triglycerides have been as high as 1072.  Would consider Vascepa if it is covered.  Tobacco use Continue to encourage cessation.  She is now smoking 5 cigarettes a day.  Diabetes mellitus with coincident hypertension (HCC) Another coronary artery disease equivalent.  Treatment  per primary care physician Dr. Lenise Arena.  Prior hemoglobin A1c 9.7.  Creatinine 0.78.  Hemoglobin 16.4.  ALT 22.  Trying to improve.  Tachycardia She has had tachycardia previously.  Treated with nadolol currently.  We will have her take ivabradine 15 mg 2 hours prior to scan.  She knows to go to the [employer] where her each pill would be $10 for total of $30.  Follow-up: We will follow-up with results of scan.  Medication Adjustments/Labs and Tests Ordered: Current medicines are reviewed at length with the patient today.  Concerns regarding medicines are outlined above.   Orders Placed This Encounter  Procedures   CT CORONARY MORPH W/CTA COR W/SCORE W/CA W/CM &/OR WO/CM   Basic metabolic panel   EKG 12-Lead    Meds ordered this encounter  Medications   ivabradine (CORLANOR) 5 MG TABS tablet    Sig: Take 1 tablet (5 mg total) by mouth as directed. Take 3 tablets 2 hours before your CT scan    Dispense:  3 tablet    Refill:  0    Pt will pay out of pocket - no insurance    Patient Instructions  Medication Instructions:  The current medical regimen is effective;  continue present plan and medications.  *If you need a refill on your cardiac medications before your next appointment, please call your pharmacy*  Lab Work: None needed  today. If you have labs (blood work) drawn today and your tests are completely normal, you will receive your results only by: MyChart Message (if you have MyChart) OR A paper copy in the mail If you have any lab test that is abnormal or we need to change your treatment, we will call you to review the results.   Testing/Procedures:   Your cardiac CT will be scheduled at:   [facility] [address] [place], [place] [address] [phone]  Please arrive at the [facility] main entrance (entrance A) of [facility] 30 minutes prior to test start time. Proceed to the [facility] Radiology Department (first floor) to check-in and test prep.  Please follow these instructions carefully (unless otherwise directed):  On the Night Before the Test: Be sure to Drink plenty of water. Do not consume any caffeinated/decaffeinated beverages or chocolate 12 hours prior to your test. Do not take any antihistamines 12 hours prior to your test.  On the Day of the Test: Drink plenty of water until 1 hour prior to the test. Do not eat any food 4 hours prior to the test. You may take your regular medications prior to the test.  Take metoprolol (Lopressor) two hours prior to test. HOLD Furosemide/Hydrochlorothiazide morning of the test. FEMALES- please wear underwire-free bra if available, avoid dresses & tight clothing      After the Test: Drink plenty of water. After receiving IV contrast, you may experience a mild flushed feeling. This is normal. On occasion, you may experience a mild rash up to 24 hours after the test. This is not dangerous. If this occurs, you can take Benadryl 25 mg and increase your fluid intake. If you experience trouble breathing, this can be serious. If it is severe call 911 IMMEDIATELY. If it is mild, please call our office. If you take any of these medications: Glipizide/Metformin, Avandament, Glucavance, please do not take 48 hours after completing  test unless otherwise instructed.  Please allow 2-4 weeks for scheduling of routine cardiac CTs. Some insurance companies require a  pre-authorization which may delay scheduling of this test.   For non-scheduling related questions, please contact the cardiac imaging nurse navigator should you have any questions/concerns: Rockwell Alexandria, Cardiac Imaging Nurse Navigator Larey Brick, Cardiac Imaging Nurse Navigator [facility] Direct Office Dial: [phone]   For scheduling needs, including cancellations and rescheduling, please call [place], [phone].  Follow-Up: At [facility], you and your health needs are our priority.  As part of our continuing mission to provide you with exceptional heart care, we have created designated Provider Care Teams.  These Care Teams include your primary Cardiologist (physician) and Advanced Practice Providers (APPs -  Physician Assistants and Nurse Practitioners) who all work together to provide you with the care you need, when you need it.  We recommend signing up for the patient portal called "MyChart".  Sign up information is provided on this After Visit Summary.  MyChart is used to connect with patients for Virtual Visits (Telemedicine).  Patients are able to view lab/test results, encounter notes, upcoming appointments, etc.  Non-urgent messages can be sent to your provider as well.   To learn more about what you can do with MyChart, go to [email or web address].    Your next appointment:   Follow up will be based on the results of the abovet esting.  Thank you for choosing [facility]!!     I,Mathew Stumpf,acting as a scribe for Donato Schultz, MD.,have documented all relevant documentation on the behalf of Donato Schultz, MD,as directed by  Donato Schultz, MD while in the presence of Donato Schultz, MD.  I, Donato Schultz, MD, have reviewed all documentation for this visit. The documentation on 12/08/20 for the exam,  diagnosis, procedures, and orders are all accurate and complete.   Signed, Donato Schultz, MD  12/08/2020 3:26 PM     [facility]

## 2020-12-08 NOTE — Assessment & Plan Note (Signed)
Another coronary artery disease equivalent.  Treatment per primary care physician Dr. Lenise Arena.  Prior hemoglobin A1c 9.7.  Creatinine 0.78.  Hemoglobin 16.4.  ALT 22.  Trying to improve.

## 2020-12-14 ENCOUNTER — Telehealth (HOSPITAL_COMMUNITY): Payer: Self-pay | Admitting: Emergency Medicine

## 2020-12-14 NOTE — Telephone Encounter (Signed)
Attempted to call patient regarding upcoming cardiac CT appointment. °Left message on voicemail with name and callback number °Wretha Laris RN Navigator Cardiac Imaging °[facility] °[phone] Office °[phone] Cell ° °

## 2020-12-15 ENCOUNTER — Other Ambulatory Visit (HOSPITAL_COMMUNITY): Payer: Self-pay

## 2020-12-15 ENCOUNTER — Telehealth (HOSPITAL_COMMUNITY): Payer: Self-pay | Admitting: *Deleted

## 2020-12-15 NOTE — Telephone Encounter (Signed)
Patient returning call regarding upcoming cardiac imaging study; pt verbalizes understanding of appt date/time, parking situation and where to check in, pre-test NPO status and medications ordered, and verified current allergies; name and call back number provided for further questions should they arise  Alexis Brick RN Navigator Cardiac Imaging [facility] [phone] office [phone] cell  Patient states she will be getting blood work today and will take her ivabradine two hours prior to cardiac CT scan.

## 2020-12-16 ENCOUNTER — Encounter (HOSPITAL_COMMUNITY): Payer: Self-pay

## 2020-12-16 ENCOUNTER — Ambulatory Visit (HOSPITAL_COMMUNITY)
Admission: RE | Admit: 2020-12-16 | Discharge: 2020-12-16 | Disposition: A | Discharge status: Home / Self Care | Payer: No Typology Code available for payment source | Source: Ambulatory Visit | Attending: Cardiology | Admitting: Cardiology

## 2020-12-16 ENCOUNTER — Other Ambulatory Visit: Payer: Self-pay

## 2020-12-16 DIAGNOSIS — I209 Angina pectoris, unspecified: Secondary | ICD-10-CM | POA: Insufficient documentation

## 2020-12-16 DIAGNOSIS — R072 Precordial pain: Secondary | ICD-10-CM | POA: Diagnosis not present

## 2020-12-16 MED ORDER — METOPROLOL TARTRATE 5 MG/5ML IV SOLN
INTRAVENOUS | Status: AC
  Filled 2020-12-16: qty 10

## 2020-12-16 MED ORDER — METOPROLOL TARTRATE 5 MG/5ML IV SOLN
INTRAVENOUS | Status: AC
  Administered 2020-12-16: 10 mg via INTRAVENOUS
  Filled 2020-12-16: qty 10

## 2020-12-16 MED ORDER — DILTIAZEM HCL 25 MG/5ML IV SOLN
INTRAVENOUS | Status: AC
  Administered 2020-12-16: 10 mg via INTRAVENOUS
  Filled 2020-12-16: qty 5

## 2020-12-16 MED ORDER — METOPROLOL TARTRATE 5 MG/5ML IV SOLN
5.0000 mg | INTRAVENOUS | Status: DC | PRN
  Administered 2020-12-16: 10 mg via INTRAVENOUS

## 2020-12-16 MED ORDER — DILTIAZEM HCL 25 MG/5ML IV SOLN: 5.0000 mg | INTRAVENOUS | Status: DC | PRN

## 2020-12-16 MED ORDER — NITROGLYCERIN 0.4 MG SL SUBL: 0.8000 mg | SUBLINGUAL_TABLET | Freq: Once | SUBLINGUAL | Status: DC

## 2020-12-16 NOTE — Progress Notes (Signed)
Pt's heart rate remains in the 80s even after 20mg  IV metoprolol and 10 mg IV diltiazem given. CT navigator Sarah notified. Procedure aborted per protocol. Explained situation to pt and she verbalized understanding and is in agreement with plan. IV removed and pt discharged to Radiology waiting area. No s/s of distress at this time.

## 2021-01-05 ENCOUNTER — Other Ambulatory Visit (HOSPITAL_COMMUNITY): Payer: Self-pay | Admitting: Emergency Medicine

## 2021-01-08 ENCOUNTER — Telehealth: Payer: Self-pay | Admitting: *Deleted

## 2021-01-08 NOTE — Telephone Encounter (Signed)
Left message for pt to c/b to discuss nuc study.  Need to know if she is able to walk the treadmill or if she will need Lexiscan. WT 207 lbs Glipizide, metformin

## 2021-01-08 NOTE — Telephone Encounter (Signed)
Thanks. In that case cancel cardiac CT. Appreciate the info.  Pam, Let's order a NUC stress test.  Thanks   Donato Schultz, MD

## 2021-01-13 NOTE — Telephone Encounter (Signed)
Called to speak with pt.  Per husband, she is at work and will not be home until 6:30 or after.  He states pt received my message the other day but is concerned about having a different test done when the last one "didn't work and she got charge for 6 to $700 for the medication they used.  She also had to take the day off from work.  Advised this is a different type of testing, not one were the heart rate has to be so low to began with and they would be able to get results.  Husband states understanding and that he will have pt call back to discuss.

## 2021-01-15 NOTE — Telephone Encounter (Signed)
Pt has not called back to discuss need for further testing.  Will close this encounter and await a return call from pt.

## 2021-03-10 ENCOUNTER — Telehealth: Payer: Self-pay | Admitting: Cardiology

## 2021-03-10 ENCOUNTER — Encounter: Payer: Self-pay | Admitting: *Deleted

## 2021-03-10 ENCOUNTER — Telehealth (HOSPITAL_COMMUNITY): Payer: Self-pay | Admitting: *Deleted

## 2021-03-10 DIAGNOSIS — R002 Palpitations: Secondary | ICD-10-CM

## 2021-03-10 DIAGNOSIS — R072 Precordial pain: Secondary | ICD-10-CM

## 2021-03-10 DIAGNOSIS — R000 Tachycardia, unspecified: Secondary | ICD-10-CM

## 2021-03-10 NOTE — Telephone Encounter (Signed)
Patient given detailed instructions per Myocardial Perfusion Study Information Sheet for the test on 03/12/21 at 7:15. Patient notified to arrive 15 minutes early and that it is imperative to arrive on time for appointment to keep from having the test rescheduled.  If you need to cancel or reschedule your appointment, please call the office within 24 hours of your appointment. . Patient verbalized understanding.Alexis Moore

## 2021-03-10 NOTE — Telephone Encounter (Signed)
Returned call to Pt.  Pt was seen in October 2022 by Dr. Anne Fu for chest pain.  She had a cardiac CT ordered but could not complete because her heart rate was too high.  She was contacted in November to change study to a nuclear stress test.  But per Pt-her family life was hectic at that time and she had a hospital bill from the CT scan so she had to delay rescheduling testing.  Pt is now ready to schedule a lexiscan myoview.    Additionally, Pt states she has had the feeling that her heart is going out of rhythm.  She describes it as feeling like her heart "is flipping upside down".   She advises she has had a previous heart monitor but it was years ago.  She does not remember being told she had PVC's or any other type of palpitations.  Discussed with Dr. Anne Fu.  Will order lexiscan myoview and 30 day heart monitor.  Will send to precert.

## 2021-03-10 NOTE — Telephone Encounter (Signed)
Patient called stating she was supposed to have a CT done but her HR was unable to be lowered to have the CT done.  Since then she has had multiple episodes, last night being the worst. Wants to know what she needs to do next.

## 2021-03-10 NOTE — Telephone Encounter (Signed)
Pt left work number to return call to.  Call returned to Pt's employment.  Pt was not at her desk when this nurse returned call.  Pt has been offered a stress test previously since she could not do the Cardiac CT.  Pt did not return call (stress test offered in November 2022).  Advised receptionist that Pt would need to return call to main [place] number.

## 2021-03-10 NOTE — Telephone Encounter (Signed)
° °  Pt is returning call, she said they can't answer their cell phone during work, but when [place] calls to ask the front desk to pull her out of the room

## 2021-03-12 ENCOUNTER — Ambulatory Visit (HOSPITAL_COMMUNITY): Payer: No Typology Code available for payment source | Attending: Cardiovascular Disease

## 2021-03-12 ENCOUNTER — Other Ambulatory Visit: Payer: Self-pay

## 2021-03-12 DIAGNOSIS — R072 Precordial pain: Secondary | ICD-10-CM | POA: Diagnosis present

## 2021-03-12 LAB — MYOCARDIAL PERFUSION IMAGING
LV dias vol: 59 mL (ref 46–106)
LV sys vol: 23 mL
Nuc Stress EF: 61 %
Peak HR: 123 {beats}/min
Rest HR: 53 {beats}/min
Rest Nuclear Isotope Dose: 10.2 mCi
SDS: 0
SRS: 0
SSS: 0
ST Depression (mm): 0 mm
Stress Nuclear Isotope Dose: 32.3 mCi
TID: 1

## 2021-03-12 MED ORDER — REGADENOSON 0.4 MG/5ML IV SOLN
0.4000 mg | Freq: Once | INTRAVENOUS | Status: AC
  Administered 2021-03-12: 0.4 mg via INTRAVENOUS

## 2021-03-12 MED ORDER — TECHNETIUM TC 99M TETROFOSMIN IV KIT
10.2000 | PACK | Freq: Once | INTRAVENOUS | Status: AC | PRN
  Administered 2021-03-12: 10.2 via INTRAVENOUS
  Filled 2021-03-12: qty 11

## 2021-03-12 MED ORDER — TECHNETIUM TC 99M TETROFOSMIN IV KIT
32.3000 | PACK | Freq: Once | INTRAVENOUS | Status: AC | PRN
  Administered 2021-03-12: 32.3 via INTRAVENOUS
  Filled 2021-03-12: qty 33

## 2021-03-15 ENCOUNTER — Ambulatory Visit (INDEPENDENT_AMBULATORY_CARE_PROVIDER_SITE_OTHER): Payer: No Typology Code available for payment source

## 2021-03-15 ENCOUNTER — Encounter: Payer: Self-pay | Admitting: Cardiology

## 2021-03-15 ENCOUNTER — Telehealth: Payer: Self-pay | Admitting: Cardiology

## 2021-03-15 DIAGNOSIS — R000 Tachycardia, unspecified: Secondary | ICD-10-CM | POA: Diagnosis not present

## 2021-03-15 DIAGNOSIS — R002 Palpitations: Secondary | ICD-10-CM | POA: Diagnosis not present

## 2021-03-15 NOTE — Telephone Encounter (Signed)
Patient is requesting to review her stress test results. °

## 2021-03-15 NOTE — Telephone Encounter (Signed)
Received EKG and will have Dr Anne Fu review 1/10.

## 2021-03-15 NOTE — Telephone Encounter (Signed)
Excellent stress test, reassuring, no signs of ischemia.  Low risk.  Normal pulm function.  Donato Schultz, MD   Spoke with pt and reviewed results of recent stress test.  She states understanding. She has not received the monitor that was ordered. (Was mailed to home address 1/4)  She reports she is having irregular heart beats and was able to catch an EKG demonstrating PVCs and ventricular bigeminy.  Pt will fax it to the office for review.

## 2021-03-17 ENCOUNTER — Telehealth: Payer: Self-pay

## 2021-03-17 MED ORDER — METOPROLOL TARTRATE 50 MG PO TABS: 50.0000 mg | ORAL_TABLET | Freq: Two times a day (BID) | ORAL | 3 refills | Status: DC

## 2021-03-17 NOTE — Telephone Encounter (Signed)
° °  Cardiac Monitor Alert  Date of alert:  03/17/2021   Patient Name: Alexis Moore  DOB: 1965-03-17  MRN: [id number]   [facility] Cardiologist: [facility] EP:  None    Monitor Information: Cardiac Event Monitor [[employer]]  Reason:  palpitations Ordering provider:  Dr. Marlou Porch   Alert  This is the 1st alert for this rhythm.   Next Cardiology Appointment {  None at this time  Pt was aware of episode and symptomatic. Felt palpitations. Arrhythmia, symptoms and history reviewed with Dr. Marlou Porch.   Plan:  Per Dr. Anitra Lauth appears to be atrial tachycardia with aberancy, variable conduction with bigeminy.  Plan-DC Nadolol. Start metoprolol tartrate 50 mg PO BID.   Pt aware and in agreement with plan    Damian Leavell, RN  03/17/2021 2:35 PM

## 2021-03-17 NOTE — Telephone Encounter (Signed)
Per Dr Anne Fu - he did review the EKG which demonstrated ventricular bigeminy.  Pt is currently wearing a cardiac event monitor.  Received call today from [employer]: Pt was aware of episode and symptomatic. Felt palpitations. Arrhythmia, symptoms and history reviewed with Dr. Anne Fu.   Plan:  Per Dr. Alfonse Ras appears to be atrial tachycardia with aberancy, variable conduction with bigeminy.   Plan-DC Nadolol. Start metoprolol tartrate 50 mg PO BID.   Pt aware and in agreement with plan Wiliam Ke, RN

## 2021-03-26 ENCOUNTER — Telehealth: Payer: Self-pay | Admitting: Cardiology

## 2021-03-26 NOTE — Telephone Encounter (Signed)
Pt reports HR has been 90s to 124 bpm currently.  She has been taking Metoprolol tartrate 50 mg BID as ordered.  BP is 122/76. When her HR increases, at times she had chest discomfort.  She is still wearing the 30 day heart monitor. She does report having "2 good days this week" where she didn't have the increased HR. She questions if she can take Metoprolol tartrate 100 mg in AM and continue 50 mg in PM.  Advised I will review with Dr Anne Fu and call her back.

## 2021-03-26 NOTE — Telephone Encounter (Signed)
Reviewed the above information with Dr Anne Fu who orders pt to take Metoprolol tartrate 100 QAM and continue 50 mg QPM.  Pt is aware.  She does not need a new RX at this time.  She will c/b if no improvement in s/s.

## 2021-03-26 NOTE — Telephone Encounter (Signed)
Pt c/o medication issue:  1. Name of Medication: Metoprolol  2. How are you currently taking this medication (dosage and times per day)?  2 times a day  3. Are you having a reaction (difficulty breathing--STAT)? No  4. What is your medication issue? Patient wants to know if she can increase the Metoprolol? She is having more frequent frequent episodes of Tachycardia   Pt says this her work number, if she does not answer, somebody else will. Please tell them she said to find her for this call please.

## 2021-04-19 ENCOUNTER — Telehealth: Payer: Self-pay | Admitting: *Deleted

## 2021-04-19 DIAGNOSIS — I4891 Unspecified atrial fibrillation: Secondary | ICD-10-CM

## 2021-04-19 DIAGNOSIS — R9431 Abnormal electrocardiogram [ECG] [EKG]: Secondary | ICD-10-CM

## 2021-04-19 NOTE — Telephone Encounter (Signed)
Continue with metoprolol for now.  Lets also refer her to electrophysiology for consultation.  Lets go ahead and check echocardiogram-abnormal EKG  Donato Schultz, MD   Long term monitor results:  Episode on 03/17/2021 of rapid atrial fibrillation up to 220 bpm began at 10:46 AM and lasted for 5 minutes duration. During this episode, flutter/chest pain or pressure was noted. Short burst of wide-complex tachycardia/ventricular tachycardia noted on 03/28/2021 lasting 4 beats duration, another episode lasting 5 beats occurred on 03/30/2021. Rare PACs, PVCs    Left message at home # to c/b for results.

## 2021-04-20 NOTE — Telephone Encounter (Signed)
Reviewed results of long term monitor with pt.  She is aware she will be contacted to be scheduled to have echo and consult with EP.  She will continue her current therapy at this point.

## 2021-04-21 ENCOUNTER — Ambulatory Visit (HOSPITAL_COMMUNITY): Payer: No Typology Code available for payment source | Attending: Internal Medicine

## 2021-04-21 ENCOUNTER — Other Ambulatory Visit: Payer: Self-pay

## 2021-04-21 DIAGNOSIS — R9431 Abnormal electrocardiogram [ECG] [EKG]: Secondary | ICD-10-CM | POA: Insufficient documentation

## 2021-04-21 DIAGNOSIS — I4891 Unspecified atrial fibrillation: Secondary | ICD-10-CM | POA: Insufficient documentation

## 2021-04-21 LAB — ECHOCARDIOGRAM COMPLETE
Area-P 1/2: 4.85 cm2
S' Lateral: 3.1 cm

## 2021-04-21 NOTE — Telephone Encounter (Signed)
Pt had echo 2/15 and is scheduled with Dr Elberta Fortis 2/23 for further eval.

## 2021-04-29 ENCOUNTER — Other Ambulatory Visit: Payer: Self-pay

## 2021-04-29 ENCOUNTER — Ambulatory Visit (INDEPENDENT_AMBULATORY_CARE_PROVIDER_SITE_OTHER): Payer: No Typology Code available for payment source | Admitting: Cardiology

## 2021-04-29 ENCOUNTER — Encounter: Payer: Self-pay | Admitting: Cardiology

## 2021-04-29 VITALS — BP 152/84 | HR 89 | Ht 67.0 in | Wt 210.6 lb

## 2021-04-29 DIAGNOSIS — I480 Paroxysmal atrial fibrillation: Secondary | ICD-10-CM | POA: Diagnosis not present

## 2021-04-29 MED ORDER — DILTIAZEM HCL ER COATED BEADS 240 MG PO CP24: 240.0000 mg | ORAL_CAPSULE | Freq: Every day | ORAL | 3 refills | Status: DC

## 2021-04-29 NOTE — Patient Instructions (Addendum)
Medication Instructions:  Your physician has recommended you make the following change in your medication:  STOP Metoprolol Tartrate START Diltiazem 240 mg once daily  *If you need a refill on your cardiac medications before your next appointment, please call your pharmacy*   Lab Work: None ordered   Testing/Procedures: None ordered   Follow-Up: At [facility], you and your health needs are our priority.  As part of our continuing mission to provide you with exceptional heart care, we have created designated Provider Care Teams.  These Care Teams include your primary Cardiologist (physician) and Advanced Practice Providers (APPs -  Physician Assistants and Nurse Practitioners) who all work together to provide you with the care you need, when you need it.  Your next appointment:   3 month(s)  The format for your next appointment:   In Person  Provider:   Loman Brooklyn, MD    Thank you for choosing [facility]!!   Dory Horn, RN [phone]   Other Instructions     Diltiazem Tablets What is this medication? DILTIAZEM (dil TYE a zem) treats high blood pressure and prevents chest pain (angina). It works by relaxing the blood vessels, which helps decrease the amount of work your heart has to do. It belongs to a group of medications called calcium channel blockers. This medicine may be used for other purposes; ask your health care provider or pharmacist if you have questions. COMMON BRAND NAME(S): Cardizem What should I tell my care team before I take this medication? They need to know if you have any of these conditions: Heart attack Heart disease Irregular heartbeat or rhythm Low blood pressure An unusual or allergic reaction to diltiazem, other medications, foods, dyes, or preservatives Pregnant or trying to get pregnant Breast-feeding How should I use this medication? Take this medication by mouth. Take it as directed on the prescription label at the  same time every day. Keep taking it unless your care team tells you to stop. Talk to your care team about the use of this medication in children. Special care may be needed. Overdosage: If you think you have taken too much of this medicine contact a poison control center or emergency room at once. NOTE: This medicine is only for you. Do not share this medicine with others. What if I miss a dose? If you miss a dose, take it as soon as you can. If it is almost time for your next dose, take only that dose. Do not take double or extra doses. What may interact with this medication? Do not take this medication with any of the following: Cisapride Hawthorn Pimozide Ranolazine Red yeast rice This medication may also interact with the following: Buspirone Carbamazepine Cimetidine Cyclosporine Digoxin Local anesthetics or general anesthetics Lovastatin Medications for anxiety or difficulty sleeping like midazolam and triazolam Medications for high blood pressure or heart problems Quinidine Rifampin, rifabutin, or rifapentine This list may not describe all possible interactions. Give your health care provider a list of all the medicines, herbs, non-prescription drugs, or dietary supplements you use. Also tell them if you smoke, drink alcohol, or use illegal drugs. Some items may interact with your medicine. What should I watch for while using this medication? Visit your care team for regular checks on your progress. Check your blood pressure as directed. Ask your care team what your blood pressure should be. Also, find out when you should contact them. Do not treat yourself for coughs, colds, or pain while you are using this  medication without asking your care team for advice. Some medications may increase your blood pressure. This medication may cause serious skin reactions. They can happen weeks to months after starting the medication. Contact your care team right away if you notice fevers or  flu-like symptoms with a rash. The rash may be red or purple and then turn into blisters or peeling of the skin. Or, you might notice a red rash with swelling of the face, lips or lymph nodes in your neck or under your arms. You may get drowsy or dizzy. Do not drive, use machinery, or do anything that needs mental alertness until you know how this medication affects you. Do not stand up or sit up quickly, especially if you are an older patient. This reduces the risk of dizzy or fainting spells. What side effects may I notice from receiving this medication? Side effects that you should report to your care team as soon as possible: Allergic reactions--skin rash, itching, hives, swelling of the face, lips, tongue, or throat Heart failure--shortness of breath, swelling of the ankles, feet, or hands, sudden weight gain, unusual weakness or fatigue Slow heart beat--dizziness, feeling faint or lightheaded, trouble breathing, unusual weakness or fatigue Liver injury--right upper belly pain, loss of appetite, nausea, light-colored stool, dark yellow or brown urine, yellowing skin or eyes, unusual weakness or fatigue Low blood pressure--dizziness, feeling faint or lightheaded, blurry vision Redness, blistering, peeling, or loosening of the skin, including inside the mouth Side effects that usually do not require medical attention (report to your care team if they continue or are bothersome): Constipation Facial flushing, redness Headache This list may not describe all possible side effects. Call your doctor for medical advice about side effects. You may report side effects to FDA at 1-800-FDA-1088. Where should I keep my medication? Keep out of the reach of children and pets. Store at room temperature between 15 and 30 degrees C (59 and 86 degrees F). Protect from moisture. Keep the container tightly closed. Throw away any unused medication after the expiration date. NOTE: This sheet is a summary. It may not  cover all possible information. If you have questions about this medicine, talk to your doctor, pharmacist, or health care provider.  2022 Elsevier/Gold Standard (2020-11-10 00:00:00)

## 2021-04-29 NOTE — Progress Notes (Signed)
Electrophysiology Office Note   Date:  04/29/2021   ID:  Alexis, Moore Mar 07, 1966, MRN [id number]  PCP:  Orpah Melter, MD  Cardiologist:  Marlou Porch Primary Electrophysiologist:  Ahmani Daoud Meredith Leeds, MD    Chief Complaint: AF   History of Present Illness: Alexis Moore is a 56 y.o. female who is being seen today for the evaluation of AF at the request of Jerline Pain, MD. Presenting today for electrophysiology evaluation.  She has a history significant for hyper glycemia.  She has had 3 episodes of sudden jaw pain that radiated down her neck.  First 2 episodes occurred June 2022 and lasted a few minutes.  With her episode was in July while she was working.  The pain lasted for 5 minutes and then spontaneously resolved.  Prior to her episodes, she was regularly exercising, going to the gym 3 days a week with weight lifting and walking on a treadmill.  She wore a cardiac monitor that showed episodes of atrial fibrillation, lasting up to 5 minutes at a time.  Since being on her metoprolol she is continue to have episodes of atrial fibrillation, though she has not had any further chest or jaw pain.  She is concerned that since she started her metoprolol that her blood sugars have been difficult to control.  Today, she denies symptoms of palpitations, chest pain, shortness of breath, orthopnea, PND, lower extremity edema, claudication, dizziness, presyncope, syncope, bleeding, or neurologic sequela. The patient is tolerating medications without difficulties.    Past Medical History:  Diagnosis Date   Hyperglycemia    Tachycardia    Past Surgical History:  Procedure Laterality Date   ABDOMINAL HYSTERECTOMY     APPENDECTOMY     CHOLECYSTECTOMY       Current Outpatient Medications  Medication Sig Dispense Refill   diazepam (VALIUM) 5 MG tablet Take 5 mg by mouth every 4 (four) hours as needed for anxiety.     diltiazem (CARDIZEM CD) 240 MG 24 hr capsule Take 1 capsule (240  mg total) by mouth daily. 30 capsule 3   ezetimibe (ZETIA) 10 MG tablet Take 10 mg by mouth daily.     gemfibrozil (LOPID) 600 MG tablet Take 600 mg by mouth 2 (two) times daily.     glipiZIDE (GLUCOTROL) 10 MG tablet Take 10 mg by mouth 2 (two) times daily.     HYDROcodone-acetaminophen (NORCO/VICODIN) 5-325 MG tablet Take by mouth as needed.     ibuprofen (ADVIL,MOTRIN) 800 MG tablet Take 1 tablet (800 mg total) by mouth 3 (three) times daily. 21 tablet 0   metFORMIN (GLUCOPHAGE-XR) 500 MG 24 hr tablet Take 1,000 mg by mouth 2 (two) times daily.     nitroGLYCERIN (NITROSTAT) 0.4 MG SL tablet Place under the tongue as needed.     ondansetron (ZOFRAN ODT) 8 MG disintegrating tablet 8mg  ODT q8 hours prn nausea 10 tablet 0   tamsulosin (FLOMAX) 0.4 MG CAPS capsule Take 1 capsule (0.4 mg total) by mouth daily. (Patient taking differently: Take 0.4 mg by mouth daily as needed (as directed).) 10 capsule 0   traMADol (ULTRAM) 50 MG tablet Take 1 tablet (50 mg total) by mouth every 6 (six) hours as needed. 9 tablet 0   TRULICITY [id number] 0000000 SOPN Inject into the skin once a week.     No current facility-administered medications for this visit.    Allergies:   Fenofibrate, Isosorbide nitrate, Morphine sulfate, Rosuvastatin, and Morphine and related   Social  History:  The patient  reports that she quit smoking about 12 months ago. Her smoking use included cigarettes. She smoked an average of .5 packs per day. She has been exposed to tobacco smoke. She has never used smokeless tobacco. She reports that she does not drink alcohol and does not use drugs.   Family History:  The patient's family history includes Diabetes in her maternal grandmother and mother; Heart attack in her maternal grandfather, maternal uncle, and maternal uncle; Supraventricular tachycardia in her mother.    ROS:  Please see the history of present illness.   Otherwise, review of systems is positive for none.   All other systems are  reviewed and negative.    PHYSICAL EXAM: VS:  BP (!) 152/84    Pulse 89    Ht 5\' 7"  (1.702 m)    Wt 210 lb 9.6 oz (95.5 kg)    SpO2 95%    BMI 32.98 kg/m  , BMI Body mass index is 32.98 kg/m. GEN: Well nourished, well developed, in no acute distress  HEENT: normal  Neck: no JVD, carotid bruits, or masses Cardiac: RRR; no murmurs, rubs, or gallops,no edema  Respiratory:  clear to auscultation bilaterally, normal work of breathing GI: soft, nontender, nondistended, + BS MS: no deformity or atrophy  Skin: warm and dry Neuro:  Strength and sensation are intact Psych: euthymic mood, full affect  EKG:  EKG is ordered today. Personal review of the ekg ordered shows sinus rhythm  Recent Labs: No results found for requested labs within last 8760 hours.    Lipid Panel  No results found for: CHOL, TRIG, HDL, CHOLHDL, VLDL, LDLCALC, LDLDIRECT   Wt Readings from Last 3 Encounters:  04/29/21 210 lb 9.6 oz (95.5 kg)  03/12/21 207 lb (93.9 kg)  12/08/20 207 lb 1.6 oz (93.9 kg)      Other studies Reviewed: Additional studies/ records that were reviewed today include: TTE 2/15/2 Review of the above records today demonstrates:   1. Left ventricular ejection fraction, by estimation, is 60 to 65%. The  left ventricle has normal function. The left ventricle has no regional  wall motion abnormalities. Left ventricular diastolic parameters were  normal.   2. Right ventricular systolic function is normal. The right ventricular  size is normal.   3. The mitral valve is normal in structure. No evidence of mitral valve  regurgitation.   4. The aortic valve is tricuspid. Aortic valve regurgitation is not  visualized.   Cardiac monitor 04/19/2021 personally reviewed Episode on 03/17/2021 of rapid atrial fibrillation up to 220 bpm began at 10:46 AM and lasted for 5 minutes duration. During this episode, flutter/chest pain or pressure was noted. Short burst of wide-complex tachycardia/ventricular  tachycardia noted on 03/28/2021 lasting 4 beats duration, another episode lasting 5 beats occurred on 03/30/2021. Rare PACs, PVCs   ASSESSMENT AND PLAN:  1.  Paroxysmal atrial fibrillation: Currently on metoprolol 50 mg twice daily.  CHA2DS2-VASc of 1 and thus not anticoagulated.  She feels much better on her metoprolol, she is continue to have episodes but is no longer having pain.  She is concerned that her metoprolol is making her diabetes more difficult to control.  Due to that, we Cova Knieriem stop metoprolol and start her on diltiazem 240 mg daily.  Case discussed with primary cardiology  Current medicines are reviewed at length with the patient today.   The patient does not have concerns regarding her medicines.  The following changes were made today: Stop  metoprolol, start diltiazem  Labs/ tests ordered today include:  Orders Placed This Encounter  Procedures   EKG 12-Lead     Disposition:   FU with Hong Moring 3 months  Signed, Zamar Odwyer Meredith Leeds, MD  04/29/2021 4:25 PM     [address] [place] [address] [phone] (office) [phone] (fax)

## 2021-05-03 ENCOUNTER — Encounter: Payer: Self-pay | Admitting: Cardiology

## 2021-05-04 ENCOUNTER — Telehealth: Payer: Self-pay | Admitting: Cardiology

## 2021-05-04 ENCOUNTER — Encounter: Payer: Self-pay | Admitting: Cardiology

## 2021-05-04 NOTE — Telephone Encounter (Signed)
Good Morning Alexis Moore, I started my diltiazem on Friday.  Since then my heart rate has consistently stayed between 100 and 135.  It has been regular for the most part but am having some chest pain at time.  Do I need to give the medication more time to work or does the dose need to be adjusted? Thanks,  Alexis Moore

## 2021-05-04 NOTE — Telephone Encounter (Signed)
Left detailed message informing pt I would send her MD recommendation this way. Dr. Curt Bears recommends increase Diltiazem to 360 mg once daily Will await confirmation from pt before sending in Rx.

## 2021-05-04 NOTE — Telephone Encounter (Signed)
Left message.  See mychart message from yesterday for further documentation

## 2021-05-05 MED ORDER — METOPROLOL TARTRATE 50 MG PO TABS: 50.0000 mg | ORAL_TABLET | Freq: Two times a day (BID) | ORAL | 3 refills | Status: DC

## 2021-05-07 MED ORDER — METOPROLOL TARTRATE 50 MG PO TABS: 50.0000 mg | ORAL_TABLET | Freq: Three times a day (TID) | ORAL | 1 refills | Status: DC

## 2021-08-07 ENCOUNTER — Other Ambulatory Visit: Payer: Self-pay | Admitting: Cardiology

## 2021-08-13 ENCOUNTER — Ambulatory Visit (INDEPENDENT_AMBULATORY_CARE_PROVIDER_SITE_OTHER): Payer: No Typology Code available for payment source | Admitting: Cardiology

## 2021-08-13 ENCOUNTER — Encounter: Payer: Self-pay | Admitting: Cardiology

## 2021-08-13 VITALS — BP 114/84 | HR 90 | Ht 67.0 in | Wt 190.4 lb

## 2021-08-13 DIAGNOSIS — I480 Paroxysmal atrial fibrillation: Secondary | ICD-10-CM

## 2021-08-13 NOTE — Progress Notes (Signed)
Electrophysiology Office Note   Date:  08/13/2021   ID:  Alexis Moore, DOB Jan 23, 1966, MRN [id number]  PCP:  Orpah Melter, MD  Cardiologist:  Marlou Porch Primary Electrophysiologist:  Trinitee Horgan Meredith Leeds, MD    Chief Complaint: AF   History of Present Illness: Alexis Moore is a 56 y.o. female who is being seen today for the evaluation of AF at the request of Orpah Melter, MD. Presenting today for electrophysiology evaluation.  He has a history of hyperglycemia.  She had 3 episodes of sudden jaw pain that radiated to her neck.  First 2 episodes occurred June 2022 and lasted a few minutes.  Her most recent episode was in July while working.  She had pain that lasted for 5 minutes and spontaneously resolved.  Prior to her episode she was exercising regularly.  She wore a cardiac monitor that showed episodes of atrial fibrillation lasting up to 5 minutes at a time.  Since being on metoprolol she continued to have episodes of atrial fibrillation but had improvement in her chest and jaw pain.  Today, denies symptoms of palpitations, chest pain, shortness of breath, orthopnea, PND, lower extremity edema, claudication, dizziness, presyncope, syncope, bleeding, or neurologic sequela. The patient is tolerating medications without difficulties.  She has been feeling well and has not noted any further episodes of atrial fibrillation.  Unfortunately, 1 month ago, her husband was hospitalized in the ICU and passed away.  She has been under quite a bit of stress.  She has been having some discomfort in her chest that she feels is due to stress.  Is not related to exertion.  She was able to mow the lawn today without issue.   Past Medical History:  Diagnosis Date   Hyperglycemia    Tachycardia    Past Surgical History:  Procedure Laterality Date   ABDOMINAL HYSTERECTOMY     APPENDECTOMY     CHOLECYSTECTOMY       Current Outpatient Medications  Medication Sig Dispense Refill   diazepam  (VALIUM) 5 MG tablet Take 5 mg by mouth every 4 (four) hours as needed for anxiety.     ezetimibe (ZETIA) 10 MG tablet Take 10 mg by mouth daily.     gemfibrozil (LOPID) 600 MG tablet Take 600 mg by mouth 2 (two) times daily.     glipiZIDE (GLUCOTROL) 10 MG tablet Take 10 mg by mouth 2 (two) times daily.     HYDROcodone-acetaminophen (NORCO/VICODIN) 5-325 MG tablet Take by mouth as needed.     JARDIANCE 25 MG TABS tablet Take 25 mg by mouth every morning.     metFORMIN (GLUCOPHAGE-XR) 500 MG 24 hr tablet Take 1,000 mg by mouth 2 (two) times daily.     metoprolol tartrate (LOPRESSOR) 50 MG tablet TAKE 1 TABLET(50 MG) BY MOUTH THREE TIMES DAILY 270 tablet 1   nitroGLYCERIN (NITROSTAT) 0.4 MG SL tablet Place 0.4 mg under the tongue every 5 (five) minutes as needed for chest pain.     tamsulosin (FLOMAX) 0.4 MG CAPS capsule Take 0.4 mg by mouth daily as needed (kidney stones).     traMADol (ULTRAM) 50 MG tablet Take 1 tablet (50 mg total) by mouth every 6 (six) hours as needed. 9 tablet 0   No current facility-administered medications for this visit.    Allergies:   Fenofibrate, Isosorbide nitrate, Morphine sulfate, Rosuvastatin, and Morphine and related   Social History:  The patient  reports that she quit smoking about 16 months ago. Her  smoking use included cigarettes. She smoked an average of .5 packs per day. She has been exposed to tobacco smoke. She has never used smokeless tobacco. She reports that she does not drink alcohol and does not use drugs.   Family History:  The patient's family history includes Diabetes in her maternal grandmother and mother; Heart attack in her maternal grandfather, maternal uncle, and maternal uncle; Supraventricular tachycardia in her mother.   ROS:  Please see the history of present illness.   Otherwise, review of systems is positive for none.   All other systems are reviewed and negative.   PHYSICAL EXAM: VS:  BP 114/84   Pulse 90   Ht 5\' 7"  (1.702 m)    Wt 190 lb 6.4 oz (86.4 kg)   SpO2 95%   BMI 29.82 kg/m  , BMI Body mass index is 29.82 kg/m. GEN: Well nourished, well developed, in no acute distress  HEENT: normal  Neck: no JVD, carotid bruits, or masses Cardiac: RRR; no murmurs, rubs, or gallops,no edema  Respiratory:  clear to auscultation bilaterally, normal work of breathing GI: soft, nontender, nondistended, + BS MS: no deformity or atrophy  Skin: warm and dry Neuro:  Strength and sensation are intact Psych: euthymic mood, full affect  EKG:  EKG is not ordered today. Personal review of the ekg ordered 04/29/21 shows sinus rhythm, rate 89  Recent Labs: No results found for requested labs within last 365 days.    Lipid Panel  No results found for: "CHOL", "TRIG", "HDL", "CHOLHDL", "VLDL", "LDLCALC", "LDLDIRECT"   Wt Readings from Last 3 Encounters:  08/13/21 190 lb 6.4 oz (86.4 kg)  04/29/21 210 lb 9.6 oz (95.5 kg)  03/12/21 207 lb (93.9 kg)      Other studies Reviewed: Additional studies/ records that were reviewed today include: TTE 2/15/2 Review of the above records today demonstrates:   1. Left ventricular ejection fraction, by estimation, is 60 to 65%. The  left ventricle has normal function. The left ventricle has no regional  wall motion abnormalities. Left ventricular diastolic parameters were  normal.   2. Right ventricular systolic function is normal. The right ventricular  size is normal.   3. The mitral valve is normal in structure. No evidence of mitral valve  regurgitation.   4. The aortic valve is tricuspid. Aortic valve regurgitation is not  visualized.   Cardiac monitor 04/19/2021 personally reviewed Episode on 03/17/2021 of rapid atrial fibrillation up to 220 bpm began at 10:46 AM and lasted for 5 minutes duration. During this episode, flutter/chest pain or pressure was noted. Short burst of wide-complex tachycardia/ventricular tachycardia noted on 03/28/2021 lasting 4 beats duration, another  episode lasting 5 beats occurred on 03/30/2021. Rare PACs, PVCs   ASSESSMENT AND PLAN:  1.  Paroxysmal atrial fibrillation: Currently on metoprolol 50 mg 3 times daily.  CHA2DS2-VASc of 1 and thus not anticoagulated.  She is currently feeling well.  She has had some chest discomfort, but she feels that it is due to stress after losing her husband.  She has had no further episodes of A-fib.   Current medicines are reviewed at length with the patient today.   The patient does not have concerns regarding her medicines.  The following changes were made today: none  Labs/ tests ordered today include:  No orders of the defined types were placed in this encounter.    Disposition:   FU 12 months  Signed, Tiannah Greenly Jorja Loa, MD  08/13/2021 2:30 PM  [address] [place] [address] [phone] (office) [phone] (fax)

## 2022-01-14 ENCOUNTER — Other Ambulatory Visit: Payer: Self-pay | Admitting: Family Medicine

## 2022-01-14 DIAGNOSIS — Z1231 Encounter for screening mammogram for malignant neoplasm of breast: Secondary | ICD-10-CM

## 2022-02-04 ENCOUNTER — Ambulatory Visit
Admission: RE | Admit: 2022-02-04 | Discharge: 2022-02-04 | Disposition: A | Discharge status: Home / Self Care | Payer: No Typology Code available for payment source | Source: Ambulatory Visit

## 2022-02-04 DIAGNOSIS — Z1231 Encounter for screening mammogram for malignant neoplasm of breast: Secondary | ICD-10-CM

## 2022-02-23 ENCOUNTER — Other Ambulatory Visit: Payer: Self-pay | Admitting: Cardiology

## 2022-05-20 ENCOUNTER — Other Ambulatory Visit: Payer: Self-pay

## 2022-05-20 MED ORDER — METOPROLOL TARTRATE 50 MG PO TABS: ORAL_TABLET | ORAL | 1 refills | Status: DC

## 2022-06-06 LAB — LAB REPORT - SCANNED
A1c: 5.6
Albumin, Urine POC: 5.47
Creatinine, POC: 123 mg/dL
EGFR: 74
Microalb Creat Ratio: 44.5

## 2022-09-30 ENCOUNTER — Ambulatory Visit: Payer: No Typology Code available for payment source | Admitting: Cardiology

## 2022-10-05 ENCOUNTER — Other Ambulatory Visit: Payer: Self-pay | Admitting: Cardiology

## 2022-10-21 ENCOUNTER — Ambulatory Visit: Payer: No Typology Code available for payment source | Admitting: Cardiology

## 2022-11-11 ENCOUNTER — Telehealth: Payer: Self-pay

## 2022-11-11 NOTE — Telephone Encounter (Signed)
   Pre-operative Risk Assessment    Patient Name: Alexis Moore  DOB: June 01, 1965 MRN: [id number]   Last OV 08/13/21 with Dr. Elberta Fortis Next OV 11/21/22 with Dr. Elberta Fortis   Request for Surgical Clearance    Procedure:   Colonoscopy  Date of Surgery:  Clearance 12/05/22                                 Surgeon:  Dr. Kerin Salen Surgeon's Group or Practice Name:  [facility] Phone number:  [phone] Fax number:  [phone]    Type of Clearance Requested:   - Medical    Type of Anesthesia:   Propofol   Additional requests/questions:    SignedZada Finders   11/11/2022, 4:35 PM

## 2022-11-15 ENCOUNTER — Other Ambulatory Visit: Payer: Self-pay | Admitting: Cardiology

## 2022-11-16 NOTE — Telephone Encounter (Signed)
Pt OD for 12 month f/u. Pt needs appointment.

## 2022-11-21 ENCOUNTER — Encounter: Payer: Self-pay | Admitting: Cardiology

## 2022-11-21 ENCOUNTER — Ambulatory Visit: Payer: No Typology Code available for payment source | Attending: Cardiology | Admitting: Cardiology

## 2022-11-21 VITALS — BP 120/84 | HR 68 | Ht 67.0 in | Wt 185.6 lb

## 2022-11-21 DIAGNOSIS — I480 Paroxysmal atrial fibrillation: Secondary | ICD-10-CM | POA: Diagnosis not present

## 2022-11-21 DIAGNOSIS — Z01810 Encounter for preprocedural cardiovascular examination: Secondary | ICD-10-CM | POA: Diagnosis not present

## 2022-11-21 NOTE — Progress Notes (Signed)
Electrophysiology Office Note:   Date:  11/21/2022  ID:  Alexis Moore, DOB Mar 02, 1966, MRN [id number]  Primary Cardiologist: Donato Schultz, MD Electrophysiologist: Selita Staiger Jorja Loa, MD      History of Present Illness:   Alexis Moore is a 57 y.o. female with h/o atrial fibrillation seen today for routine electrophysiology followup.   Since last being seen in our clinic the patient reports doing well.  She has noted minimal to no episodes of atrial fibrillation.  She does have seasonal allergies, and she was taking Coricidin HBP.  She had palpitations for the few days around the time of taking this medication.  When she stopped the medicine, palpitations went away.  She is having an upcoming colonoscopy later this month.  she denies chest pain, palpitations, dyspnea, PND, orthopnea, nausea, vomiting, dizziness, syncope, edema, weight gain, or early satiety.   Review of systems complete and found to be negative unless listed in HPI.   EP Information / Studies Reviewed:    EKG is ordered today. Personal review as below.  EKG Interpretation Date/Time:  Monday November 21 2022 09:39:51 EDT Ventricular Rate:  68 PR Interval:  146 QRS Duration:  78 QT Interval:  388 QTC Calculation: 412 R Axis:   83  Text Interpretation: Normal sinus rhythm Normal ECG No previous ECGs available Confirmed by Harveer Sadler ([id number]) on 11/21/2022 9:47:18 AM     Risk Assessment/Calculations:    CHA2DS2-VASc Score = 1   This indicates a 0.6% annual risk of stroke. The patient's score is based upon: CHF History: 0 HTN History: 0 Diabetes History: 0 Stroke History: 0 Vascular Disease History: 0 Age Score: 0 Gender Score: 1              Physical Exam:   VS:  BP 120/84 (BP Location: Left Arm, Patient Position: Sitting, Cuff Size: Large)   Pulse 68   Ht 5\' 7"  (1.702 m)   Wt 185 lb 9.6 oz (84.2 kg)   SpO2 95%   BMI 29.07 kg/m    Wt Readings from Last 3 Encounters:  11/21/22 185 lb 9.6 oz  (84.2 kg)  08/13/21 190 lb 6.4 oz (86.4 kg)  04/29/21 210 lb 9.6 oz (95.5 kg)     GEN: Well nourished, well developed in no acute distress NECK: No JVD; No carotid bruits CARDIAC: Regular rate and rhythm, no murmurs, rubs, gallops RESPIRATORY:  Clear to auscultation without rales, wheezing or rhonchi  ABDOMEN: Soft, non-tender, non-distended EXTREMITIES:  No edema; No deformity   ASSESSMENT AND PLAN:    1.  Paroxysmal atrial fibrillation: Currently on metoprolol.  Not anticoagulated.  She remains in normal rhythm.  She has had no further episodes of atrial fibrillation.  [name] continue with current management.  2.  Preoperative evaluation: Has plans for upcoming colonoscopy.  She would be at low to intermediate risk for this intermediate risk procedure.  No further cardiac testing is necessary.  Follow up with EP APP in 12 months  Signed, Lorina Duffner Jorja Loa, MD

## 2022-12-23 ENCOUNTER — Other Ambulatory Visit: Payer: Self-pay | Admitting: Cardiology

## 2023-02-13 ENCOUNTER — Encounter (HOSPITAL_BASED_OUTPATIENT_CLINIC_OR_DEPARTMENT_OTHER): Payer: Self-pay

## 2023-02-16 ENCOUNTER — Other Ambulatory Visit: Payer: Self-pay | Admitting: Cardiology

## 2023-03-03 ENCOUNTER — Inpatient Hospital Stay (HOSPITAL_BASED_OUTPATIENT_CLINIC_OR_DEPARTMENT_OTHER)
Admission: RE | Admit: 2023-03-03 | Payer: No Typology Code available for payment source | Source: Ambulatory Visit | Admitting: Radiology

## 2023-03-03 DIAGNOSIS — Z1231 Encounter for screening mammogram for malignant neoplasm of breast: Secondary | ICD-10-CM

## 2023-03-23 ENCOUNTER — Other Ambulatory Visit (HOSPITAL_BASED_OUTPATIENT_CLINIC_OR_DEPARTMENT_OTHER): Payer: Self-pay

## 2023-03-27 ENCOUNTER — Encounter (HOSPITAL_BASED_OUTPATIENT_CLINIC_OR_DEPARTMENT_OTHER): Payer: Self-pay | Admitting: Radiology

## 2023-03-27 ENCOUNTER — Ambulatory Visit (HOSPITAL_BASED_OUTPATIENT_CLINIC_OR_DEPARTMENT_OTHER)
Admission: RE | Admit: 2023-03-27 | Discharge: 2023-03-27 | Disposition: A | Discharge status: Home / Self Care | Payer: No Typology Code available for payment source | Source: Ambulatory Visit

## 2023-03-27 DIAGNOSIS — Z1231 Encounter for screening mammogram for malignant neoplasm of breast: Secondary | ICD-10-CM | POA: Diagnosis present

## 2023-10-19 LAB — LAB REPORT - SCANNED
A1c: 6
Albumin, Urine POC: 3.01
Creatinine, POC: 96 mg/dL
EGFR: 83
Microalb Creat Ratio: 31.2

## 2023-10-27 ENCOUNTER — Telehealth: Payer: Self-pay

## 2023-10-27 NOTE — Telephone Encounter (Signed)
   Pre-operative Risk Assessment    Patient Name: Alexis Moore  DOB: October 26, 1965 MRN: [id number]   Date of last office visit: 11/21/23 Date of next office visit: 11/27/23   Request for Surgical Clearance    Procedure:  colonoscopy  Date of Surgery:  Clearance 12/18/23                                Surgeon:  Dr. Estelita manas Surgeon's Group or Practice Name:  [facility] Phone number:  [phone] Fax number:  [phone]   Type of Clearance Requested:   - Medical    Type of Anesthesia:  Not Indicated   Additional requests/questions:    Bonney Ival LOISE Gerome   10/27/2023, 5:07 PM

## 2023-10-30 NOTE — Telephone Encounter (Signed)
 Primary Cardiologist:Alexis Jeffrie, MD  Chart reviewed as part of pre-operative protocol coverage. Because of Alexis Moore's past medical history and time since last visit, he/she will require a follow-up visit in order to better assess preoperative cardiovascular risk.  Pre-op covering staff: - Patient has an appointment with Alexis Arthur, PA on 9/22 at which time clearance will be addressed. Appointment notes have been updated.  - Please contact requesting surgeon's office via preferred method (i.e, phone, fax) to inform them of need for appointment prior to surgery.  No request to hold cardiac medications.  Alexis EMERSON Bane, NP-C  10/30/2023, 7:44 AM [address], [address] [place], [place] [address] Office [phone] Fax [phone]

## 2023-11-26 NOTE — Progress Notes (Unsigned)
  Cardiology Office Note:  .   Date:  11/26/2023  ID:  Alexis Moore, DOB [birth year], MRN [id number] PCP: Alexis Senior, MD  [facility] Providers Cardiologist:  Oneil Parchment, MD Electrophysiologist:  Will Gladis Norton, MD {  History of Present Illness: .   Alexis Moore is a 58 y.o. female w/PMHx of  DM, HLD AFib  She saw Dr. Norton 11/21/22, low AF burden, was pending colonoscopy at that time. Felt reasonable to proceed with low-mod risk for her colonoscopy No changes were made.  Colonoscopy scheduled 12/18/23 RCRI score is zero - one (unknown last creat) 0.4-0.9%  Today's visit is scheduled as an annual visit, + pre-op evaluation ROS:   *** no labs??? *** symptoms *** not on oac, score is 2 w/gender   Arrhythmia/AAD hx No AAD to date  Studies Reviewed: SABRA    EKG done today and reviewed by myself:  ***   04/21/21: TTE 1. Left ventricular ejection fraction, by estimation, is 60 to 65%. The  left ventricle has normal function. The left ventricle has no regional  wall motion abnormalities. Left ventricular diastolic parameters were  normal.   2. Right ventricular systolic function is normal. The right ventricular  size is normal.   3. The mitral valve is normal in structure. No evidence of mitral valve  regurgitation.   4. The aortic valve is tricuspid. Aortic valve regurgitation is not  visualized.    Risk Assessment/Calculations:    Physical Exam:   VS:  There were no vitals taken for this visit.   Wt Readings from Last 3 Encounters:  11/21/22 185 lb 9.6 oz (84.2 kg)  08/13/21 190 lb 6.4 oz (86.4 kg)  04/29/21 210 lb 9.6 oz (95.5 kg)    GEN: Well nourished, well developed in no acute distress NECK: No JVD; No carotid bruits CARDIAC: ***RRR, no murmurs, rubs, gallops RESPIRATORY:  *** CTA b/l without rales, wheezing or rhonchi  ABDOMEN: Soft, non-tender, non-distended EXTREMITIES: *** No edema; No deformity   ASSESSMENT AND PLAN: .     Paroxysmal  AFib CHA2DS2Vasc is one (2 w/gender), not on a/c *** burden by symptoms  Pre-colonoscopy evaluation Low cardiac risk procedure Low RCRI score ***     {Are you ordering a CV Procedure (e.g. stress test, cath, DCCV, TEE, etc)?   Press F2        :[id number]}     Dispo: ***  Signed, Charlies Macario Arthur, PA-C

## 2023-11-27 ENCOUNTER — Ambulatory Visit: Attending: Physician Assistant | Admitting: Physician Assistant

## 2023-11-27 ENCOUNTER — Encounter: Payer: Self-pay | Admitting: Physician Assistant

## 2023-11-27 VITALS — BP 126/74 | HR 69 | Ht 67.0 in | Wt 183.4 lb

## 2023-11-27 DIAGNOSIS — Z01810 Encounter for preprocedural cardiovascular examination: Secondary | ICD-10-CM | POA: Diagnosis not present

## 2023-11-27 DIAGNOSIS — R079 Chest pain, unspecified: Secondary | ICD-10-CM

## 2023-11-27 DIAGNOSIS — I480 Paroxysmal atrial fibrillation: Secondary | ICD-10-CM | POA: Diagnosis not present

## 2023-11-27 LAB — BASIC METABOLIC PANEL WITH GFR
BUN/Creatinine Ratio: 22 (ref 12–28)
BUN: 20 mg/dL (ref 8–27)
CO2: 20 mmol/L (ref 20–29)
Calcium: 10.2 mg/dL (ref 8.7–10.3)
Chloride: 105 mmol/L (ref 96–106)
Creatinine, Ser: 0.93 mg/dL (ref 0.57–1.00)
Glucose: 99 mg/dL (ref 70–99)
Potassium: 4.6 mmol/L (ref 3.5–5.2)
Sodium: 143 mmol/L (ref 134–144)
eGFR: 70 mL/min/1.73 (ref >59)

## 2023-11-27 NOTE — Patient Instructions (Addendum)
 Medication Instructions:    TWO HOUR PRIOR TO SCHEDULE PROCEDURE  DATE :  TAKE ONLY  100 MG  (TWO TABLETS)  OF YOUR METOPROLOL   FOR  THAT  DAY   THEN FOLLOWING  DAY : RESUME  50 MG THREE TIME A DAY   *If you need a refill on your cardiac medications before your next appointment, please call your pharmacy*    Lab Work:  PLEASE GO DOWN STAIRS  [facility]  FIRST FLOOR   ( GET OFF ELEVATORS WALK TOWARDS WAITING AREA LAB LOCATED BY PHARMACY): BMET TODAY       If you have labs (blood work) drawn today and your tests are completely normal, you will receive your results only by: MyChart Message (if you have MyChart) OR A paper copy in the mail If you have any lab test that is abnormal or we need to change your treatment, we will call you to review the results.    Testing/Procedures: Non-Cardiac CT Angiography (CTA), is a special type of CT scan that uses a computer to produce multi-dimensional views of major blood vessels throughout the body. In CT angiography, a contrast material is injected through an IV to help visualize the blood vessels      Follow-Up: At [facility], you and your health needs are our priority.  As part of our continuing mission to provide you with exceptional heart care, our providers are all part of one team.  This team includes your primary Cardiologist (physician) and Advanced Practice Providers or APPs (Physician Assistants and Nurse Practitioners) who all work together to provide you with the care you need, when you need it.   Your next appointment:   6 month(s)  Provider:   Charlies Arthur, PA-C    We recommend signing up for the patient portal called MyChart.  Sign up information is provided on this After Visit Summary.  MyChart is used to connect with patients for Virtual Visits (Telemedicine).  Patients are able to view lab/test results, encounter notes, upcoming appointments, etc.  Non-urgent messages can be sent to your provider as well.   To  learn more about what you can do with MyChart, go to [email or web address].   Other Instructions    Your cardiac CT will be scheduled at one of the below locations:   [facility] [address] [place], [place] [address] [phone] (Severe contrast allergies only)  OR   [facility] [address] [place], [place] [address] [phone]  OR   [facility] [address] [place], [place] [address] [phone]  OR   [facility] [address]  [place], [place] [address] [phone]  OR   [facility] [address] [place], [place] [phone]  If scheduled at [facility], please arrive at the [facility] and Children's Entrance (Entrance C2) of [facility] 30 minutes prior to test start time. You can use the FREE valet parking offered at entrance C (encouraged to control the heart rate for the test)  Proceed to the [facility] Radiology Department (first floor) to check-in and test prep.  All radiology patients and guests should use entrance C2 at [facility], accessed from [facility], even though the hospital's physical address listed is [address].  If scheduled at the Heart and Vascular Tower at [address], please enter the parking lot using the [address] entrance and use the  FREE valet service at the patient drop-off area. Enter the building and check-in with registration on the main floor.  If scheduled at [facility], please arrive to the Heart and Vascular Center 15 mins early for check-in and test prep.  There is spacious parking and easy access to the radiology department from the [facility] and V[facility] entrance. Please enter here and check-in with the desk attendant.   If scheduled at [facility], please arrive 30 minutes early for check-in and test prep.  Please follow these  instructions carefully (unless otherwise directed):  An IV will be required for this test and Nitroglycerin  will be given.     On the Night Before the Test: Be sure to Drink plenty of water. Do not consume any caffeinated/decaffeinated beverages or chocolate 12 hours prior to your test. Do not take any antihistamines 12 hours prior to your test.  On the Day of the Test: Drink plenty of water until 1 hour prior to the test. Do not eat any food 1 hour prior to test. You may take your regular medications prior to the test.  Take metoprolol  (Lopressor ) two hours prior to test. If you take Furosemide/Hydrochlorothiazide/Spironolactone/Chlorthalidone, please HOLD on the morning of the test. Patients who wear a continuous glucose monitor MUST remove the device prior to scanning. FEMALES- please wear underwire-free bra if available, avoid dresses & tight clothing       After the Test: Drink plenty of water. After receiving IV contrast, you may experience a mild flushed feeling. This is normal. On occasion, you may experience a mild rash up to 24 hours after the test. This is not dangerous. If this occurs, you can take Benadryl 25 mg, Zyrtec, Claritin, or Allegra and increase your fluid intake. (Patients taking Tikosyn should avoid Benadryl, and may take Zyrtec, Claritin, or Allegra) If you experience trouble breathing, this can be serious. If it is severe call 911 IMMEDIATELY. If it is mild, please call our office.  We will call to schedule your test 2-4 weeks out understanding that some insurance companies will need an authorization prior to the service being performed.   For more information and frequently asked questions, please visit our website : [email or web address]  For non-scheduling related questions, please contact the cardiac imaging nurse navigator should you have any questions/concerns: Cardiac Imaging Nurse Navigators Direct Office Dial: [phone]   For  scheduling needs, including cancellations and rescheduling, please call [place], [phone].

## 2023-11-28 ENCOUNTER — Ambulatory Visit: Payer: Self-pay | Admitting: Physician Assistant

## 2023-11-30 ENCOUNTER — Encounter (HOSPITAL_COMMUNITY): Payer: Self-pay

## 2023-12-04 ENCOUNTER — Ambulatory Visit (HOSPITAL_COMMUNITY): Admission: RE | Admit: 2023-12-04 | Source: Ambulatory Visit

## 2023-12-07 ENCOUNTER — Encounter (HOSPITAL_COMMUNITY): Payer: Self-pay

## 2023-12-11 ENCOUNTER — Ambulatory Visit (HOSPITAL_COMMUNITY)
Admission: RE | Admit: 2023-12-11 | Discharge: 2023-12-11 | Disposition: A | Source: Ambulatory Visit | Attending: Physician Assistant

## 2023-12-11 DIAGNOSIS — I517 Cardiomegaly: Secondary | ICD-10-CM | POA: Diagnosis not present

## 2023-12-11 DIAGNOSIS — I2510 Atherosclerotic heart disease of native coronary artery without angina pectoris: Secondary | ICD-10-CM | POA: Diagnosis not present

## 2023-12-11 DIAGNOSIS — R079 Chest pain, unspecified: Secondary | ICD-10-CM | POA: Diagnosis present

## 2023-12-11 DIAGNOSIS — R918 Other nonspecific abnormal finding of lung field: Secondary | ICD-10-CM | POA: Insufficient documentation

## 2023-12-11 DIAGNOSIS — I700 Atherosclerosis of aorta: Secondary | ICD-10-CM | POA: Diagnosis not present

## 2023-12-11 MED ORDER — NITROGLYCERIN 0.4 MG SL SUBL
0.8000 mg | SUBLINGUAL_TABLET | Freq: Once | SUBLINGUAL | Status: AC
  Administered 2023-12-11: 0.8 mg via SUBLINGUAL

## 2023-12-11 MED ORDER — IOHEXOL 350 MG/ML SOLN
100.0000 mL | Freq: Once | INTRAVENOUS | Status: AC | PRN
  Administered 2023-12-11: 100 mL via INTRAVENOUS

## 2023-12-13 NOTE — Telephone Encounter (Signed)
   Patient Name: Alexis Moore  DOB: 10-20-1965 MRN: [id number]  Primary Cardiologist: Oneil Parchment, MD  Chart reviewed as part of pre-operative protocol coverage. Given past medical history and time since last visit, based on ACC/AHA guidelines, Alexis Moore is at acceptable risk for the planned procedure without further cardiovascular testing.   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 This is good news. Important that she is having her cholesterol monitored/managed, encourage heart healthy lifestyle.   No cardiac contraindication to getting colonoscopy (please forward to pre-op pool)   There was an incidental finding  2. Indeterminate 5 mm nodule in the right lung. No follow-up needed if patient is low-risk. Non-contrast chest CT can be considered in 12 months if patient is high-risk  The patient was advised that if she develops new symptoms prior to surgery to contact our office to arrange for a follow-up visit, and she verbalized understanding.  I will route this recommendation to the requesting party via Epic fax function and remove from pre-op pool.  Please call with questions.  Lamarr Satterfield, NP 12/13/2023, 2:31 PM

## 2023-12-13 NOTE — Telephone Encounter (Signed)
 2nd request received. Will route to the preop pool for the Preop APP to review.

## 2023-12-31 ENCOUNTER — Other Ambulatory Visit: Payer: Self-pay | Admitting: Cardiology
